# Patient Record
Sex: Male | Born: 1960 | Race: Black or African American | Hispanic: No | Marital: Married | Smoking: Current every day smoker
Health system: Southern US, Community
[De-identification: ages and names within clinical notes are randomized; demographics above are authoritative.]

## PROBLEM LIST (undated history)

## (undated) DIAGNOSIS — I10 Essential (primary) hypertension: Secondary | ICD-10-CM

## (undated) DIAGNOSIS — R634 Abnormal weight loss: Secondary | ICD-10-CM

## (undated) DIAGNOSIS — R079 Chest pain, unspecified: Secondary | ICD-10-CM

## (undated) DIAGNOSIS — F419 Anxiety disorder, unspecified: Secondary | ICD-10-CM

## (undated) DIAGNOSIS — I7121 Aneurysm of the ascending aorta, without rupture: Secondary | ICD-10-CM

## (undated) DIAGNOSIS — R918 Other nonspecific abnormal finding of lung field: Secondary | ICD-10-CM

## (undated) DIAGNOSIS — F1910 Other psychoactive substance abuse, uncomplicated: Secondary | ICD-10-CM

## (undated) DIAGNOSIS — F410 Panic disorder [episodic paroxysmal anxiety] without agoraphobia: Secondary | ICD-10-CM

## (undated) DIAGNOSIS — I719 Aortic aneurysm of unspecified site, without rupture: Secondary | ICD-10-CM

## (undated) DIAGNOSIS — I5022 Chronic systolic (congestive) heart failure: Secondary | ICD-10-CM

## (undated) DIAGNOSIS — I428 Other cardiomyopathies: Secondary | ICD-10-CM

## (undated) DIAGNOSIS — I7772 Dissection of iliac artery: Secondary | ICD-10-CM

## (undated) DIAGNOSIS — Z9289 Personal history of other medical treatment: Secondary | ICD-10-CM

## (undated) DIAGNOSIS — I712 Thoracic aortic aneurysm, without rupture, unspecified: Secondary | ICD-10-CM

## (undated) HISTORY — DX: Personal history of other medical treatment: Z92.89

## (undated) HISTORY — DX: Other cardiomyopathies: I42.8

## (undated) HISTORY — DX: Other nonspecific abnormal finding of lung field: R91.8

## (undated) HISTORY — DX: Other psychoactive substance abuse, uncomplicated: F19.10

## (undated) HISTORY — DX: Chronic systolic (congestive) heart failure: I50.22

## (undated) HISTORY — PX: KNEE ARTHROSCOPY W/ MENISCAL REPAIR: SHX1877

## (undated) HISTORY — DX: Aneurysm of the ascending aorta, without rupture: I71.21

## (undated) HISTORY — DX: Thoracic aortic aneurysm, without rupture, unspecified: I71.20

## (undated) HISTORY — DX: Aortic aneurysm of unspecified site, without rupture: I71.9

## (undated) HISTORY — DX: Abnormal weight loss: R63.4

## (undated) HISTORY — DX: Thoracic aortic aneurysm, without rupture: I71.2

---

## 1997-09-28 ENCOUNTER — Emergency Department (HOSPITAL_COMMUNITY): Admission: EM | Admit: 1997-09-28 | Discharge: 1997-09-28 | Payer: Self-pay | Admitting: Emergency Medicine

## 2006-07-10 ENCOUNTER — Emergency Department (HOSPITAL_COMMUNITY): Admission: EM | Admit: 2006-07-10 | Discharge: 2006-07-10 | Payer: Self-pay | Admitting: Emergency Medicine

## 2007-01-10 ENCOUNTER — Emergency Department (HOSPITAL_COMMUNITY): Admission: EM | Admit: 2007-01-10 | Discharge: 2007-01-10 | Payer: Self-pay | Admitting: Emergency Medicine

## 2010-11-17 LAB — COMPREHENSIVE METABOLIC PANEL
ALT: 22
AST: 23
Albumin: 3.8
Alkaline Phosphatase: 58
BUN: 12
CO2: 27
Calcium: 9.6
Chloride: 103
Creatinine, Ser: 1.25
GFR calc Af Amer: >60
GFR calc non Af Amer: >60
Glucose, Bld: 88
Potassium: 4.4
Sodium: 137
Total Bilirubin: 1.6 — ABNORMAL HIGH
Total Protein: 7.2

## 2010-11-17 LAB — DIFFERENTIAL
Basophils Absolute: 0.1
Basophils Relative: 1
Eosinophils Absolute: 0.1 — ABNORMAL LOW
Eosinophils Relative: 1
Lymphocytes Relative: 32
Lymphs Abs: 3.2
Monocytes Absolute: 0.4
Monocytes Relative: 4
Neutro Abs: 6.3
Neutrophils Relative %: 62

## 2010-11-17 LAB — CBC
HCT: 47.7
Hemoglobin: 16.2
MCHC: 33.9
MCV: 92.6
Platelets: 234
RBC: 5.16
RDW: 13.6
WBC: 10.1

## 2010-11-17 LAB — LIPASE, BLOOD: Lipase: 20

## 2011-10-03 ENCOUNTER — Encounter (HOSPITAL_COMMUNITY)
Admission: EM | Disposition: A | Discharge status: Home / Self Care | Payer: Self-pay | Source: Home / Self Care | Attending: Cardiovascular Disease

## 2011-10-03 ENCOUNTER — Ambulatory Visit (HOSPITAL_COMMUNITY): Admit: 2011-10-03 | Payer: Self-pay | Admitting: Cardiovascular Disease

## 2011-10-03 ENCOUNTER — Inpatient Hospital Stay (HOSPITAL_COMMUNITY): Payer: 59

## 2011-10-03 ENCOUNTER — Inpatient Hospital Stay (HOSPITAL_COMMUNITY)
Admission: EM | Admit: 2011-10-03 | Discharge: 2011-10-04 | DRG: 287 | Disposition: A | Discharge status: Home / Self Care | Payer: 59 | Attending: Cardiovascular Disease | Admitting: Cardiovascular Disease

## 2011-10-03 ENCOUNTER — Encounter (HOSPITAL_COMMUNITY): Payer: Self-pay

## 2011-10-03 DIAGNOSIS — F17200 Nicotine dependence, unspecified, uncomplicated: Secondary | ICD-10-CM | POA: Diagnosis present

## 2011-10-03 DIAGNOSIS — I10 Essential (primary) hypertension: Secondary | ICD-10-CM | POA: Diagnosis present

## 2011-10-03 DIAGNOSIS — Z8249 Family history of ischemic heart disease and other diseases of the circulatory system: Secondary | ICD-10-CM

## 2011-10-03 DIAGNOSIS — Z79899 Other long term (current) drug therapy: Secondary | ICD-10-CM

## 2011-10-03 DIAGNOSIS — R079 Chest pain, unspecified: Principal | ICD-10-CM | POA: Diagnosis present

## 2011-10-03 DIAGNOSIS — R9431 Abnormal electrocardiogram [ECG] [EKG]: Secondary | ICD-10-CM | POA: Diagnosis present

## 2011-10-03 HISTORY — PX: LEFT HEART CATHETERIZATION WITH CORONARY ANGIOGRAM: SHX5451

## 2011-10-03 HISTORY — DX: Chest pain, unspecified: R07.9

## 2011-10-03 HISTORY — DX: Essential (primary) hypertension: I10

## 2011-10-03 LAB — COMPREHENSIVE METABOLIC PANEL
Albumin: 3.1 g/dL — ABNORMAL LOW (ref 3.5–5.2)
BUN: 6 mg/dL (ref 6–23)
Calcium: 9.5 mg/dL (ref 8.4–10.5)
Chloride: 101 mEq/L (ref 96–112)
Creatinine, Ser: 0.83 mg/dL (ref 0.50–1.35)
Total Bilirubin: 1.4 mg/dL — ABNORMAL HIGH (ref 0.3–1.2)
Total Protein: 7.1 g/dL (ref 6.0–8.3)

## 2011-10-03 LAB — CBC WITH DIFFERENTIAL/PLATELET
Basophils Absolute: 0 10*3/uL (ref 0.0–0.1)
Basophils Relative: 0 % (ref 0–1)
Eosinophils Absolute: 0 10*3/uL (ref 0.0–0.7)
Eosinophils Relative: 0 % (ref 0–5)
HCT: 40.8 % (ref 39.0–52.0)
Hemoglobin: 14 g/dL (ref 13.0–17.0)
MCH: 31.9 pg (ref 26.0–34.0)
MCHC: 34.3 g/dL (ref 30.0–36.0)
MCV: 92.9 fL (ref 78.0–100.0)
Monocytes Absolute: 0.9 10*3/uL (ref 0.1–1.0)
Monocytes Relative: 10 % (ref 3–12)
RDW: 14.1 % (ref 11.5–15.5)

## 2011-10-03 LAB — TSH: TSH: 0.028 u[IU]/mL — ABNORMAL LOW (ref 0.350–4.500)

## 2011-10-03 LAB — CARDIAC PANEL(CRET KIN+CKTOT+MB+TROPI)
CK, MB: 1.1 ng/mL (ref 0.3–4.0)
Relative Index: INVALID (ref 0.0–2.5)
Total CK: 35 U/L (ref 7–232)

## 2011-10-03 LAB — HEMOGLOBIN A1C: Mean Plasma Glucose: 105 mg/dL (ref <117)

## 2011-10-03 SURGERY — LEFT HEART CATHETERIZATION WITH CORONARY ANGIOGRAM
Anesthesia: LOCAL

## 2011-10-03 MED ORDER — PNEUMOCOCCAL VAC POLYVALENT 25 MCG/0.5ML IJ INJ
0.5000 mL | INJECTION | INTRAMUSCULAR | Status: DC
  Filled 2011-10-03: qty 0.5

## 2011-10-03 MED ORDER — MORPHINE SULFATE 2 MG/ML IJ SOLN: 1.0000 mg | INTRAMUSCULAR | Status: DC | PRN

## 2011-10-03 MED ORDER — HEPARIN SODIUM (PORCINE) 1000 UNIT/ML IJ SOLN
INTRAMUSCULAR | Status: AC
  Filled 2011-10-03: qty 1

## 2011-10-03 MED ORDER — SODIUM CHLORIDE 0.9 % IV SOLN
INTRAVENOUS | Status: AC
  Administered 2011-10-03: 75 mL/h via INTRAVENOUS

## 2011-10-03 MED ORDER — LABETALOL HCL 5 MG/ML IV SOLN: 20.0000 mg | Freq: Once | INTRAVENOUS | Status: AC

## 2011-10-03 MED ORDER — LISINOPRIL 10 MG PO TABS
10.0000 mg | ORAL_TABLET | Freq: Two times a day (BID) | ORAL | Status: DC
  Administered 2011-10-03 – 2011-10-04 (×2): 10 mg via ORAL
  Filled 2011-10-03 (×3): qty 1

## 2011-10-03 MED ORDER — ONDANSETRON HCL 4 MG/2ML IJ SOLN: 4.0000 mg | Freq: Four times a day (QID) | INTRAMUSCULAR | Status: DC | PRN

## 2011-10-03 MED ORDER — AMLODIPINE BESYLATE 10 MG PO TABS
10.0000 mg | ORAL_TABLET | Freq: Every day | ORAL | Status: DC
  Administered 2011-10-03 – 2011-10-04 (×2): 10 mg via ORAL
  Filled 2011-10-03 (×2): qty 1

## 2011-10-03 MED ORDER — VERAPAMIL HCL 2.5 MG/ML IV SOLN
INTRAVENOUS | Status: AC
  Filled 2011-10-03: qty 2

## 2011-10-03 MED ORDER — NITROGLYCERIN 0.2 MG/ML ON CALL CATH LAB
INTRAVENOUS | Status: AC
  Filled 2011-10-03: qty 1

## 2011-10-03 MED ORDER — ACETAMINOPHEN 325 MG PO TABS: 650.0000 mg | ORAL_TABLET | ORAL | Status: DC | PRN

## 2011-10-03 MED ORDER — LABETALOL HCL 5 MG/ML IV SOLN
INTRAVENOUS | Status: AC
  Administered 2011-10-03: 20 mg
  Filled 2011-10-03: qty 4

## 2011-10-03 MED ORDER — HEPARIN (PORCINE) IN NACL 2-0.9 UNIT/ML-% IJ SOLN
INTRAMUSCULAR | Status: AC
  Filled 2011-10-03: qty 2000

## 2011-10-03 MED ORDER — LIDOCAINE HCL (PF) 1 % IJ SOLN
INTRAMUSCULAR | Status: AC
  Filled 2011-10-03: qty 30

## 2011-10-03 NOTE — H&P (Signed)
   Pt was reexamined and existing H & P reviewed. No changes found.  [name], MD [facility] 10/03/2011 1:07 PM

## 2011-10-03 NOTE — Op Note (Signed)
Allen Saunders is a 51 y.o. male    [id number] LOCATION:  FACILITY: [facility]  PHYSICIAN: Nanetta Batty, M.D. Feb 13, 1960   DATE OF PROCEDURE:  10/03/2011  DATE OF DISCHARGE:  [facility]  CARDIAC CATHETERIZATION     History obtained from chart review. [name] is a 51 year old married Afro-American male father of 2 stepchildren retired Hotel manager who was seen at urgent care today because of coughing and chest wall pain. He was found to have inferior ST segment elevation was transferred to [facility] for further evaluation. Because of this risk factor profile including family history the father died of MI at age 21, tobacco abuse and hypertension along with abnormal EKG and ongoing chest pain it was decided then to the Cath Lab for emergent cath intervention.   PROCEDURE DESCRIPTION:    The patient was brought to the second floor  [facility]iac cath lab in the postabsorptive state. He was not  premedicated .Marland Kitchen His right wrist was prepped and shaved in usual sterile fashion. Xylocaine 1% was used  for local anesthesia. A 6 French sheath was inserted into the right radial  artery using standard Seldinger technique. The patient received  4000 units  of heparin  intravenously.  A 6 [place] TIG catheter along with a 6 French pigtail catheter was used for selective coronary angiography and left ventriculography respectively. Visipaque dye was used for the entirety of the case. Retrograde aortic, left ventricular pullback pressures were recorded.    HEMODYNAMICS:    AO SYSTOLIC/AO DIASTOLIC: 126/80   LV SYSTOLIC/LV DIASTOLIC: 129/7  ANGIOGRAPHIC RESULTS:   1. Left main; normal  2. LAD; normal 3. Left circumflex; normal and nondominant.  4. Right coronary artery; normal and dominant 5. Left ventriculography; RAO left ventriculogram was performed using  25 cc of Visipaque dye at 12 cc per second. There were no wall motion abnormalities.the EF was estimated  visually at 60%.  IMPRESSION:[name] has normal coronary arteries and normal left ventricular function. Believe his chest pain is chest wall from coughing and his EKG or represents early repolarization pattern. Continued medical therapy will be recommended. The sheath was removed and it a TR band is placed on the right wrist chief patent hemostasis. The patient left the Cath Lab in stable condition. Initially hydrated and discharged home in the morning.  [name] MD, [facility] 10/03/2011 1:11 PM

## 2011-10-03 NOTE — ED Notes (Signed)
Patient stated that he only gets sore throat with ASA but not all the time and that he takes it. Denies any rash, no swelling, CP nor SOB.

## 2011-10-03 NOTE — ED Notes (Signed)
Dr. Gery Pray is at the bedside.

## 2011-10-03 NOTE — H&P (Signed)
Allen Saunders is an 51 y.o. male.   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  Thr pain was like someone standing on his chest-per his wife.  He had to sit up due to severity of pain.  He also complained of throat discomfort.  He went to Urgent Care and EKG was done with ST elevatin in inf. Lat leads, though could be early repol.  But due to severity of pain, and family history of CAD with pt's use of tobacco and his HTN, poorly controlled today, it was decided to go to the cath lab.   Past Medical History  Diagnosis Date  . Hypertension   . Chest pain, with abnormal EKG 10/03/2011  . HTN (hypertension), malignant 10/03/2011    History reviewed. No pertinent past surgical history.  Family History  Problem Relation Age of Onset  . Coronary artery disease Father    Social History:  reports that he has been smoking.  He does not have any smokeless tobacco history on file. He reports that he does not drink alcohol or use illicit drugs. marrried with 2 children and 2 step children  Allergies:  Allergies  Allergen Reactions  . Asa (Aspirin) Other (See Comments)    Sore throat, gi upset    Medications Prior to Admission  Medication Sig Dispense Refill  . amLODipine (NORVASC) 10 MG tablet Take 10 mg by mouth daily.      . Thiamine HCl (VITAMIN B-1 PO) Take 1 tablet by mouth daily.        No results found for this or any previous visit (from the past 48 hour(s)). No results found.  ROS: General:no colds or fevers Skin:no rashes or ulcers HEENT:normocephalic, sclera clear WU:JWJXB pain as described PUL:no SOB GI:no diarrhea, no constipation GU:no hematuria, no dysuria MS:no joint pain Neuro:no syncope,no dizziness Endo:no diabetes, no thyroid disease   Blood pressure 142/100, pulse 101, temperature 98.1 F (36.7 C), temperature source Oral,  resp. rate 16, height 6' 4.5" (1.943 m), weight 70.761 kg (156 lb), SpO2 100.00%. PE: General:alert and oriented X 3 NAD Skin:Warm and dry, no rashes HEENT:normocephalic, sclera clear Neck:supple, no JVD, no bruits Heart:S1S2, RRR without murmur galllup rub or click Lungs:clear, no rales or wheezes Abd:+ BS, soft nontender, + BS  Ext:no edema  2+ pedal and post tib pulses Neuro:alert and oriented X 3    Assessment/Plan Active Problems:  Chest pain, with abnormal EKG   HTN uncontrolled  PLAN: To cath lab emergently.  JYNWGN,FAOZH R 10/03/2011, 12:45 PM    Agree with note written by Nada Boozer RNP  51 year old black male with atypical chest wall pain, coughing and early  repolarization changes on EKG with receptors include positive family history, 2 hypertension and tobacco abuse. His exam is normal. Because of his abnormal EKG and chest pain the plan will be to bring him to the cath lab to rule out an ischemic etiology.   Allen Saunders 10/03/2011 1:24 PM

## 2011-10-03 NOTE — ED Notes (Signed)
To cath Lab via stretcher accompanied by RN and [facility] ED Tech

## 2011-10-03 NOTE — ED Notes (Signed)
Patient was brought in by ambulance from Urgent Care with possible STEMI. Patient stated that he has been coughing since last night with chest soreness and sore throat. Patient was given ASA 324 mg po and NTG 1 tab SL. Patient's initial BP was 188/110 and after he was given NTG, his BP was 158/100.

## 2011-10-03 NOTE — Progress Notes (Signed)
Chaplain Note:  Chaplain responded to code STEMI page.  Chaplain provided spiritual comfort and support for pt, family, and cath lab staff.  Chaplain acted as Print production planner between lab and  pt's family.  Family, staff, and pt expressed appreciation for chaplain support.  Chaplain will follow up as needed.  10/03/11 1220  Clinical Encounter Type  Visited With Patient;Family  Visit Type Spiritual support;Code (Code STEMI)  Referral From Other (Comment) (Code STEMI page)  Spiritual Encounters  Spiritual Needs Emotional  Stress Factors  Patient Stress Factors Loss of control;Health changes  Family Stress Factors Loss of control   Verdie Shire, chaplain [phone]

## 2011-10-04 LAB — BASIC METABOLIC PANEL
BUN: 8 mg/dL (ref 6–23)
CO2: 29 mEq/L (ref 19–32)
Chloride: 102 mEq/L (ref 96–112)
GFR calc Af Amer: >90 mL/min (ref >90)
Potassium: 4 mEq/L (ref 3.5–5.1)

## 2011-10-04 LAB — CBC
HCT: 42.1 % (ref 39.0–52.0)
Hemoglobin: 14.1 g/dL (ref 13.0–17.0)
MCV: 94.4 fL (ref 78.0–100.0)
WBC: 9.2 10*3/uL (ref 4.0–10.5)

## 2011-10-04 LAB — LIPID PANEL
HDL: 60 mg/dL (ref >39)
LDL Cholesterol: 79 mg/dL (ref 0–99)
Triglycerides: 76 mg/dL (ref <150)
VLDL: 15 mg/dL (ref 0–40)

## 2011-10-04 MED ORDER — LISINOPRIL 10 MG PO TABS: 10.0000 mg | ORAL_TABLET | Freq: Two times a day (BID) | ORAL | Status: DC

## 2011-10-04 NOTE — Progress Notes (Signed)
Subjective:   Objective: Vital signs in last 24 hours: Temp:  [98.1 F (36.7 C)-99.6 F (37.6 C)] 98.8 F (37.1 C) (08/25 0400) Pulse Rate:  [80-113] 87  (08/25 0500) Resp:  [16-25] 22  (08/24 1700) BP: (117-193)/(74-118) 117/76 mmHg (08/25 0500) SpO2:  [97 %-100 %] 98 % (08/25 0500) Weight:  [70.761 kg (156 lb)-75.6 kg (166 lb 10.7 oz)] 75.6 kg (166 lb 10.7 oz) (08/24 1345) Weight change:    Intake/Output from previous day: -15 08/24 0701 - 08/25 0700 In: 1085 [P.O.:700; I.V.:385] Out: 1100 [Urine:1100] Intake/Output this shift:    PE: General: Skin: HEENT:NCAT Neck:2+ carotids Heart:RRR Lungs:Clear Abd: DGU:YQIHK radial puncture OK Neuro:   Lab Results:  Basename 10/04/11 0555 10/03/11 1536  WBC 9.2 9.3  HGB 14.1 14.0  HCT 42.1 40.8  PLT 156 156   BMET  Basename 10/04/11 0555 10/03/11 1536  NA 139 134*  K 4.0 3.7  CL 102 101  CO2 29 23  GLUCOSE 95 146*  BUN 8 6  CREATININE 1.04 0.83  CALCIUM 9.7 9.5    Basename 10/03/11 1535  TROPONINI <0.30    Lab Results  Component Value Date   CHOL 154 10/04/2011   HDL 60 10/04/2011   LDLCALC 79 10/04/2011   TRIG 76 10/04/2011   CHOLHDL 2.6 10/04/2011   Lab Results  Component Value Date   HGBA1C 5.3 10/03/2011     Lab Results  Component Value Date   TSH 0.028* 10/03/2011    Hepatic Function Panel  Basename 10/03/11 1536  PROT 7.1  ALBUMIN 3.1*  AST 12  ALT 10  ALKPHOS 68  BILITOT 1.4*  BILIDIR --  IBILI --    Basename 10/04/11 0555  CHOL 154   No results found for this basename: PROTIME in the last 72 hours    EKG:No orders found for this or any previous visit.  Studies/Results: Dg Chest 2 View  10/03/2011  *RADIOLOGY REPORT*  Clinical Data: Cough and shortness of breath.  CHEST - 2 VIEW  Comparison: None  Findings: The cardiac silhouette, mediastinal and hilar contours are normal.  The lungs are clear.  No pleural effusion or pneumothorax. Tiny nodule in the left upper lobe could be a  calcified granuloma or something in the rib.  I would recommend follow-up chest x-ray in4- 6 months to reevaluate.  The bony thorax is intact.  A mild pectus deformity is noted.  IMPRESSION:  1.  No acute cardiopulmonary findings. 2.  Possible small left lower lobe pulmonary nodule.  Recommend follow-up chest x-ray in 4-6 months.   Original Report Authenticated By: P. Loralie Champagne, M.D.     Medications: I have reviewed the patient's current medications.    Marland Kitchen amLODipine  10 mg Oral Daily  . heparin      . heparin      . labetalol      . labetalol  20 mg Intravenous Once  . lidocaine      . lisinopril  10 mg Oral BID  . nitroGLYCERIN      . pneumococcal 23 valent vaccine  0.5 mL Intramuscular Tomorrow-1000  . verapamil       Assessment/Plan: Active Problems:  Chest pain, with abnormal EKG, non cardiac, patent cors. on cath 10/03/11   HTN uncontrolled   PLAN: hypertension now controlled with IV Labetalol, and addition of Lisinopril.  Ambulate and D/C home if no further problems. TSH is low at 0.028 this was drawn after cath.   LOS: 1  day   INGOLD,LAURA R 10/04/2011, 7:45 AM  Agree with note written by Nada Boozer RNP  Clean cath. EKG prob early repolarization changes (nl variant for him). Enz neg. Labs otherwise OK except for low TSH. Small lung nodule on CXR that will need close follow up. OK for D/C home. Follow up with Tally Joe PAC at [facility].Needs CRF modification/smoking cessation.  [name] 10/04/2011 8:42 AM

## 2011-10-05 LAB — POCT I-STAT, CHEM 8
Calcium, Ion: 1.24 mmol/L — ABNORMAL HIGH (ref 1.12–1.23)
HCT: 40 % (ref 39.0–52.0)
Hemoglobin: 13.6 g/dL (ref 13.0–17.0)
TCO2: 20 mmol/L (ref 0–100)

## 2011-10-05 NOTE — Progress Notes (Signed)
Retro ur review 

## 2011-10-08 NOTE — Discharge Summary (Signed)
**Note Allen-Identified via Obfuscation** Physician Discharge Summary  Patient ID: Allen Saunders MRN: [id number] DOB/AGE: Nov 30, 1960 51 y.o.  Admit date: 10/03/2011 Discharge date: 10/04/2011  Discharge Diagnoses:  Active Problems:  Chest pain, with abnormal EKG, non cardiac, patent cors. on cath 10/03/11, pain due to uncontrolled HTN   HTN uncontrolled   Discharged Condition: good  Procedures: 10/03/2011 combined heart cath by Dr. Nanetta Saunders.  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 Thr pain was like someone standing on his chest-per his wife. He had to sit up due to severity of pain. He also complained of throat discomfort. He went to Urgent Care and EKG was done with ST elevatin in inf. Lat leads, though could be early repol. But due to severity of pain, and family history of CAD with pt's use of tobacco and his HTN, poorly controlled today, it was decided to go to the cath lab.  Cardiac catheterization revealed:  IMPRESSION:[name] has normal coronary arteries and normal left ventricular function. Believe his chest pain is chest wall from coughing and his EKG or represents early repolarization pattern.    His hypertension continued to be uncontrolled post cath he was given IV labetalol with improvement.  By the next morning patient had no further chest pain blood pressure was controlled. He ambulated without complications was seen by Dr. Allyson Saunders and felt stable, ready for discharge home.  He'll followup with his primary care for further chest discomfort due to coughing and hypertension.  Altace was added to his medical regimen.    He'll need followup chest x-ray as an outpatient as well as thyroid evaluation.     Consults: None  Significant Diagnostic Studies: Laboratory data  Cardiac enzymes were negative  Total cholesterol 154 triglycerides 76 HDL 60 LDL 79 BMET    Component Value Date/Time   NA 139 10/04/2011 0555   K 4.0 10/04/2011  0555   CL 102 10/04/2011 0555   CO2 29 10/04/2011 0555   GLUCOSE 95 10/04/2011 0555   BUN 8 10/04/2011 0555   CREATININE 1.04 10/04/2011 0555   CALCIUM 9.7 10/04/2011 0555   GFRNONAA 81* 10/04/2011 0555   GFRAA >90 10/04/2011 0555    CBC    Component Value Date/Time   WBC 9.2 10/04/2011 0555   RBC 4.46 10/04/2011 0555   HGB 14.1 10/04/2011 0555   HCT 42.1 10/04/2011 0555   PLT 156 10/04/2011 0555   MCV 94.4 10/04/2011 0555   MCH 31.6 10/04/2011 0555   MCHC 33.5 10/04/2011 0555   RDW 14.2 10/04/2011 0555   LYMPHSABS 1.8 10/03/2011 1536   MONOABS 0.9 10/03/2011 1536   EOSABS 0.0 10/03/2011 1536   BASOSABS 0.0 10/03/2011 1536   TSH 0.028 this was drawn post cath needs to be rechecked as an outpatient.  Hemoglobin A1c 5.3  Two-view chest x-ray on admission 1. No acute cardiopulmonary findings.  2. Possible small left lower lobe pulmonary nodule. Recommend  follow-up chest x-ray in 4-6 months.     Discharge Exam: Blood pressure 127/80, pulse 86, temperature 98 F (36.7 C), temperature source Oral, resp. rate 18, height 6\' 6"  (1.981 m), weight 75.6 kg (166 lb 10.7 oz), SpO2 99.00%. PE: HEENT:NCAT  Neck:2+ carotids  Heart:RRR  Lungs:Clear  WUJ:WJXBJ radial puncture OK    Disposition: 01-Home or Self Care   Medication List  As of 10/08/2011 12:13 PM   TAKE these medications  amLODipine 10 MG tablet   Commonly known as: NORVASC   Take 10 mg by mouth daily.      lisinopril 10 MG tablet   Commonly known as: PRINIVIL,ZESTRIL   Take 1 tablet (10 mg total) by mouth 2 (two) times daily.      VITAMIN B-1 PO   Take 1 tablet by mouth daily.           Follow-up Information    Follow up with Millsaps, Allen Millin, NP. (call and make an appointment for follow up for an abnormal thyroid study and an abnormal CXR  )    Contact information:   [facility] [address] [place] [address] [phone]        Discharge instructions:  We  increased your BP meds, added a new medication.    Stop smoking.  Heart Healthy Diet.  Good News your coronary arteries are open.  No lifting for 5 days with rt arm No driving for 2 days   Signed: INGOLD,LAURA Saunders 10/08/2011, 12:13 PM

## 2012-01-03 ENCOUNTER — Emergency Department (HOSPITAL_COMMUNITY): Payer: 59

## 2012-01-03 ENCOUNTER — Encounter (HOSPITAL_COMMUNITY): Payer: Self-pay | Admitting: *Deleted

## 2012-01-03 ENCOUNTER — Emergency Department (HOSPITAL_COMMUNITY)
Admission: EM | Admit: 2012-01-03 | Discharge: 2012-01-03 | Disposition: A | Discharge status: Home / Self Care | Payer: 59 | Attending: Emergency Medicine | Admitting: Emergency Medicine

## 2012-01-03 DIAGNOSIS — F17200 Nicotine dependence, unspecified, uncomplicated: Secondary | ICD-10-CM | POA: Insufficient documentation

## 2012-01-03 DIAGNOSIS — F410 Panic disorder [episodic paroxysmal anxiety] without agoraphobia: Secondary | ICD-10-CM

## 2012-01-03 DIAGNOSIS — Z79899 Other long term (current) drug therapy: Secondary | ICD-10-CM | POA: Insufficient documentation

## 2012-01-03 DIAGNOSIS — I10 Essential (primary) hypertension: Secondary | ICD-10-CM

## 2012-01-03 DIAGNOSIS — R51 Headache: Secondary | ICD-10-CM | POA: Insufficient documentation

## 2012-01-03 LAB — COMPREHENSIVE METABOLIC PANEL
AST: 30 U/L (ref 0–37)
Albumin: 3.8 g/dL (ref 3.5–5.2)
Alkaline Phosphatase: 97 U/L (ref 39–117)
Chloride: 100 mEq/L (ref 96–112)
Creatinine, Ser: 1.41 mg/dL — ABNORMAL HIGH (ref 0.50–1.35)
Potassium: 4.1 mEq/L (ref 3.5–5.1)
Sodium: 136 mEq/L (ref 135–145)
Total Bilirubin: 0.4 mg/dL (ref 0.3–1.2)

## 2012-01-03 LAB — CBC
Platelets: 241 10*3/uL (ref 150–400)
RDW: 14.2 % (ref 11.5–15.5)
WBC: 12.5 10*3/uL — ABNORMAL HIGH (ref 4.0–10.5)

## 2012-01-03 LAB — POCT I-STAT TROPONIN I

## 2012-01-03 MED ORDER — LORAZEPAM 1 MG PO TABS: 1.0000 mg | ORAL_TABLET | Freq: Three times a day (TID) | ORAL | Status: DC | PRN

## 2012-01-03 MED ORDER — METOPROLOL TARTRATE 12.5 MG HALF TABLET: 12.5000 mg | ORAL_TABLET | Freq: Two times a day (BID) | ORAL | Status: DC

## 2012-01-03 NOTE — ED Notes (Signed)
EKG given to Dr. Anitra Lauth and copy was placed in pt chart.

## 2012-01-03 NOTE — ED Provider Notes (Addendum)
History     CSN: [id number]  Arrival date & time 01/03/12  1804   First MD Initiated Contact with Patient 01/03/12 1921      Chief Complaint  Patient presents with  . Hypertension  . Headache    (Consider location/radiation/quality/duration/timing/severity/associated sxs/prior treatment) HPI Comments: While he is driving in the car his hands and feet will become sweaty he will feel very shaky, dizzy with palpitations and nervous. His head will pound. The only thing that makes it better is resting and breathing through it sometimes it can last 30 minutes to an hour. These episodes did not start until after he left the hospital after a cardiac evaluation and started on lisinopril  Patient is a 51 y.o. male presenting with hypertension, headaches, and anxiety. The history is provided by the patient.  Hypertension This is a chronic problem. The problem occurs constantly. The problem has not changed since onset.Associated symptoms include headaches. The symptoms are aggravated by stress. The symptoms are relieved by rest. The treatment provided no relief.  Headache   Anxiety This is a new (Patient states that it always seems to be related to being in the car by himself.) problem. Episode onset: Three- 2 months. The problem occurs every several days. The problem has been gradually worsening. Associated symptoms include headaches.    Past Medical History  Diagnosis Date  . Hypertension   . Chest pain, with abnormal EKG 10/03/2011    History reviewed. No pertinent past surgical history.  Family History  Problem Relation Age of Onset  . Coronary artery disease Father     History  Substance Use Topics  . Smoking status: Current Every Day Smoker  . Smokeless tobacco: Not on file  . Alcohol Use: Yes     Comment: beer daily      Review of Systems  Musculoskeletal:       Left knee pain that has been worsening since a fall several months ago. Patient noticed swelling 2-3 days ago  and pain with walking  Neurological: Positive for headaches.  All other systems reviewed and are negative.    Allergies  Asa  Home Medications   Current Outpatient Rx  Name  Route  Sig  Dispense  Refill  . AMLODIPINE BESYLATE 10 MG PO TABS   Oral   Take 10 mg by mouth daily.         Marland Kitchen LISINOPRIL 10 MG PO TABS   Oral   Take 1 tablet (10 mg total) by mouth 2 (two) times daily.   60 tablet   11     BP 178/103  Pulse 96  Temp 98.3 F (36.8 C) (Oral)  Resp 20  SpO2 100%  Physical Exam  Nursing note and vitals reviewed. Constitutional: He is oriented to person, place, and time. He appears well-developed and well-nourished. No distress.  HENT:  Head: Normocephalic and atraumatic.  Mouth/Throat: Oropharynx is clear and moist.  Eyes: Conjunctivae normal and EOM are normal. Pupils are equal, round, and reactive to light.  Neck: Normal range of motion. Neck supple.  Cardiovascular: Normal rate, regular rhythm and intact distal pulses.   No murmur heard. Pulmonary/Chest: Effort normal and breath sounds normal. No respiratory distress. He has no wheezes. He has no rales.  Abdominal: Soft. He exhibits no distension. There is no tenderness. There is no rebound and no guarding.  Musculoskeletal: Normal range of motion. He exhibits no edema and no tenderness.       Left knee: He exhibits  swelling and effusion. He exhibits normal range of motion and no deformity. tenderness found. Medial joint line and lateral joint line tenderness noted.  Neurological: He is alert and oriented to person, place, and time.  Skin: Skin is warm and dry. No rash noted. No erythema.  Psychiatric: He has a normal mood and affect. His behavior is normal.    ED Course  Procedures (including critical care time)  Labs Reviewed  CBC - Abnormal; Notable for the following:    WBC 12.5 (*)     All other components within normal limits  COMPREHENSIVE METABOLIC PANEL - Abnormal; Notable for the following:      Glucose, Bld 118 (*)     Creatinine, Ser 1.41 (*)     GFR calc non Af Amer 56 (*)     GFR calc Af Amer 65 (*)     All other components within normal limits  POCT I-STAT TROPONIN I  TSH  T4, FREE   Dg Chest 2 View  01/03/2012  *RADIOLOGY REPORT*  Clinical Data: Palpitations.  CHEST - 2 VIEW  Comparison: Chest x-ray 10/03/2011.  Findings: Lung volumes are normal.  No consolidative airspace disease.  No pleural effusions.  No pneumothorax.  No pulmonary nodule or mass noted.  Pulmonary vasculature and the cardiomediastinal silhouette are within normal limits.  Tiny dense nodular opacity again noted projecting over the lateral aspect of the left upper lobe and interface between the anterolateral 3rd rib and posterolateral fifth rib; this finding is consistent with either a small calcified granuloma or a small bone island within the rib.  IMPRESSION: 1. No radiographic evidence of acute cardiopulmonary disease.   Original Report Authenticated By: Trudie Reed, M.D.    Dg Knee Complete 4 Views Left  01/03/2012  *RADIOLOGY REPORT*  Clinical Data: Injury, pain.  LEFT KNEE - COMPLETE 4+ VIEW  Comparison: None.  Findings: No fracture or dislocation is identified.  The patient has a small joint effusion.  There is a small osteochondroma off the lateral cortex of the proximal tibial metaphysis.  IMPRESSION:  1.  Negative for fracture. 2.  Small joint effusion. 3.  Small osteochondroma proximal tibia.   Original Report Authenticated By: Holley Dexter, M.D.     Date: 01/03/2012  Rate: 77  Rhythm: normal sinus rhythm  QRS Axis: normal  Intervals: normal  ST/T Wave abnormalities: nonspecific ST changes  Conduction Disutrbances:none  Narrative Interpretation:   Old EKG Reviewed: unchanged    No diagnosis found.    MDM   The patient is presenting today do to hypertension and what sounds like panic attacks. He states these attacks have been happening ever since he was discharged in the  hospital on lisinopril. He states they usually happen in the car. He starts feeling sweaty, palpitations and extremely panicked. He is unable to drive a car after this he starting to get more and more episodes. He denies shut chest pain or shortness of breath. He does have a history of a possible MI at the end of August but did not require stenting. He's been following up at urgent care however they have continued the lisinopril. He states he's also had a significant amount of weight loss of 30 pounds over the last 4-5 months. He states that they were supposed to check his thyroid but nobody has.  He does have a doctor at the [place] but is not scheduled to see them until sometime next week. Today on arrival patient's blood pressure is 173/104. He has  taken the Norvasc today but has not taken lisinopril. He's been on Norvasc for years and states he's had no issues with this. Labs show a mild elevation in his creatinine from 1.0-1.4. Electrolytes are within normal limits. Chest x-ray and EKG are negative. Thyroid test were sent. Patient could be having an adverse reaction from the lisinopril or this could be thyroid related or this could be depression manifesting as anxiety do to recent medical issues loss of his job and not feeling him not his normal self.  Will have him DC the lisinopril and will start him on a second agent as his blood pressure does continue to be elevated here. There no signs of endorgan damage. We'll also give the patient a prescription for a small amount of Ativan that he can take when he has an episode and he will followup at the [place] next week. 9:05 PM No acute findings.  Will d/c home.  h- [phone] c- [id number]  Gwyneth Sprout, MD 01/03/12 2107  Gwyneth Sprout, MD 01/05/12 2108

## 2012-01-03 NOTE — ED Notes (Addendum)
Pt reports having episodes of hands and feet sweating, anxiety, headache and feeling dizzy. Has been having episodes for extended amount of time, having problems with htn. bp 173/104 at triage. Reports that he takes norvasc and lisinopril for bp, just ran out of lisinopril and did not take it today.

## 2012-01-04 LAB — TSH: TSH: 1.265 u[IU]/mL (ref 0.350–4.500)

## 2012-01-04 LAB — T4, FREE: Free T4: 0.89 ng/dL (ref 0.80–1.80)

## 2012-12-20 ENCOUNTER — Encounter (HOSPITAL_COMMUNITY): Payer: Self-pay | Admitting: Emergency Medicine

## 2012-12-20 ENCOUNTER — Emergency Department (HOSPITAL_COMMUNITY)
Admission: EM | Admit: 2012-12-20 | Discharge: 2012-12-20 | Disposition: A | Discharge status: Home / Self Care | Payer: 59 | Attending: Emergency Medicine | Admitting: Emergency Medicine

## 2012-12-20 ENCOUNTER — Emergency Department (HOSPITAL_COMMUNITY): Payer: 59

## 2012-12-20 DIAGNOSIS — Y92009 Unspecified place in unspecified non-institutional (private) residence as the place of occurrence of the external cause: Secondary | ICD-10-CM | POA: Insufficient documentation

## 2012-12-20 DIAGNOSIS — W1809XA Striking against other object with subsequent fall, initial encounter: Secondary | ICD-10-CM | POA: Diagnosis not present

## 2012-12-20 DIAGNOSIS — F410 Panic disorder [episodic paroxysmal anxiety] without agoraphobia: Secondary | ICD-10-CM | POA: Diagnosis not present

## 2012-12-20 DIAGNOSIS — I10 Essential (primary) hypertension: Secondary | ICD-10-CM | POA: Diagnosis not present

## 2012-12-20 DIAGNOSIS — Y9389 Activity, other specified: Secondary | ICD-10-CM | POA: Insufficient documentation

## 2012-12-20 DIAGNOSIS — S20219A Contusion of unspecified front wall of thorax, initial encounter: Secondary | ICD-10-CM | POA: Insufficient documentation

## 2012-12-20 DIAGNOSIS — W010XXA Fall on same level from slipping, tripping and stumbling without subsequent striking against object, initial encounter: Secondary | ICD-10-CM | POA: Diagnosis not present

## 2012-12-20 DIAGNOSIS — Z79899 Other long term (current) drug therapy: Secondary | ICD-10-CM | POA: Diagnosis not present

## 2012-12-20 DIAGNOSIS — S298XXA Other specified injuries of thorax, initial encounter: Secondary | ICD-10-CM | POA: Diagnosis present

## 2012-12-20 DIAGNOSIS — IMO0002 Reserved for concepts with insufficient information to code with codable children: Secondary | ICD-10-CM | POA: Insufficient documentation

## 2012-12-20 DIAGNOSIS — F17200 Nicotine dependence, unspecified, uncomplicated: Secondary | ICD-10-CM | POA: Diagnosis not present

## 2012-12-20 DIAGNOSIS — S20211A Contusion of right front wall of thorax, initial encounter: Secondary | ICD-10-CM

## 2012-12-20 HISTORY — DX: Anxiety disorder, unspecified: F41.9

## 2012-12-20 HISTORY — DX: Panic disorder (episodic paroxysmal anxiety): F41.0

## 2012-12-20 MED ORDER — OXYCODONE-ACETAMINOPHEN 5-325 MG PO TABS: 1.0000 | ORAL_TABLET | ORAL | Status: DC | PRN

## 2012-12-20 NOTE — ED Notes (Signed)
Pt fell this am striking right posterior ribs on a chair. Has large abrasion in this area. Pt here today for ongoing pain.

## 2012-12-20 NOTE — ED Provider Notes (Signed)
CSN: [id number]     Arrival date & time 12/20/12  1603 History   First MD Initiated Contact with Patient 12/20/12 1735    This chart was scribed for Allen Forth PA-C, a non-physician practitioner working with [name], MD by Lewanda Rife, ED Scribe. This patient was seen in room TR08C/TR08C and the patient's care was started at 5:54 PM      Chief Complaint  Patient presents with  . Rib Injury   (Consider location/radiation/quality/duration/timing/severity/associated sxs/prior Treatment) The history is provided by the patient. No language interpreter was used.   HPI Comments: Allen Saunders is a 52 y.o. male who presents to the Emergency Department with PMHx of HTN, and anxiety complaining of constant moderate right sided rib pain onset 4:30 this morning following a mechanical fall after tripping over a folding chair in his garage. Reports associated mild pain with inspiration, and superficial abrasion on right side. Reports pain is exacerbated by touch and alleviated by nothing. Denies associated head injury, neck pain, back pain, LOC, and hematuria. 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 Denies associated other pertinent surgical hx. Reports taking prescribed medications as prescribed. Reports tetanus status is up to date.     Past Medical History  Diagnosis Date  . Hypertension   . Chest pain, with abnormal EKG 10/03/2011  . Anxiety   . Panic attack    History reviewed. No pertinent past surgical history. Family History  Problem Relation Age of Onset  . Coronary artery disease Father    History  Substance Use Topics  . Smoking status: Current Every Day Smoker  . Smokeless tobacco: Not on file  . Alcohol Use: Yes     Comment: beer daily    Review of Systems  Constitutional: Negative for fever and chills.  Cardiovascular: Positive for chest pain.  Gastrointestinal: Negative for nausea and vomiting.  Musculoskeletal: Positive for arthralgias and myalgias. Negative for  back pain, joint swelling, neck pain and neck stiffness.  Skin: Negative for wound.  Neurological: Negative for numbness.  Hematological: Does not bruise/bleed easily.  Psychiatric/Behavioral: The patient is not nervous/anxious.   All other systems reviewed and are negative.   A complete 10 system review of systems was obtained and all systems are negative except as noted in the HPI and PMHx.    Allergies  Asa  Home Medications   Current Outpatient Rx  Name  Route  Sig  Dispense  Refill  . amLODipine (NORVASC) 10 MG tablet   Oral   Take 10 mg by mouth daily.         Marland Kitchen EXPIRED: lisinopril (PRINIVIL,ZESTRIL) 10 MG tablet   Oral   Take 1 tablet (10 mg total) by mouth 2 (two) times daily.   60 tablet   11   . LORazepam (ATIVAN) 1 MG tablet   Oral   Take 1 tablet (1 mg total) by mouth every 8 (eight) hours as needed for anxiety.   10 tablet   0   . metoprolol (LOPRESSOR) 12.5 mg TABS   Oral   Take 0.5 tablets (12.5 mg total) by mouth 2 (two) times daily.   30 tablet   0   . oxyCODONE-acetaminophen (PERCOCET/ROXICET) 5-325 MG per tablet   Oral   Take 1-2 tablets by mouth every 4 (four) hours as needed for severe pain.   21 tablet   0    BP 130/93  Pulse 88  Temp(Src) 98.1 F (36.7 C) (Oral)  Resp 16  SpO2 100% Physical  Exam  Nursing note and vitals reviewed. Constitutional: He appears well-developed and well-nourished. No distress.  Awake, alert, nontoxic appearance  HENT:  Head: Normocephalic and atraumatic.  Mouth/Throat: Oropharynx is clear and moist. No oropharyngeal exudate.  Eyes: Conjunctivae are normal. Pupils are equal, round, and reactive to light. No scleral icterus.  Neck: Normal range of motion and full passive range of motion without pain. Neck supple. No spinous process tenderness and no muscular tenderness present. No rigidity. Normal range of motion present.  No midline or paraspinal tenderness  Cardiovascular: Normal rate, regular rhythm,  normal heart sounds and intact distal pulses.   No murmur heard. Pulmonary/Chest: Effort normal and breath sounds normal. No respiratory distress. He has no wheezes. He exhibits tenderness and bony tenderness.    No flail of chest or crepitus  Clear and equal breath sounds with normal breath sounds   Abdominal: Soft. Bowel sounds are normal. He exhibits no mass. There is no tenderness. There is no rebound and no guarding.  Musculoskeletal: Normal range of motion. He exhibits tenderness. He exhibits no edema.       Back:  TTP of right lower ribs  No C-spine, T-spine, or L-spine tenderness midline or paraspinal   Lymphadenopathy:    He has no cervical adenopathy.  Neurological: He is alert.  Speech is clear and goal oriented Moves extremities without ataxia  Skin: Skin is warm and dry. He is not diaphoretic.  Large contusion and abrasion of right lower rib   Psychiatric: He has a normal mood and affect.    ED Course  Procedures (including critical care time) 5:54 PM Nursing Notes Reviewed/ Care Coordinated Applicable Imaging Reviewed and incorporated into ED treatment Discussed results and treatment plan with pt. Pt demonstrates understanding and agrees with plan. Pt informed of return precautions and is comfortable with discharge at this time.     Labs Review Labs Reviewed - No data to display Imaging Review Dg Ribs Unilateral W/chest Right  12/20/2012   CLINICAL DATA:  Posterior right lower rib discomfort.  EXAM: RIGHT RIBS AND CHEST - 3+ VIEW  COMPARISON:  January 03, 2012 chest x-ray  FINDINGS: A metallic BB was placed over the inferior aspect of the left ribcage. Deep to this the lower ribs appear intact. The other ribs appear intact where visualized as well. There is no pleural effusion or pneumothorax. The chest film reveals both lungs to be well-expanded and clear. The cardiac silhouette is normal in size. The pulmonary vascularity is not engorged.  IMPRESSION: There is no  evidence of an acute right rib fracture. There is no pleural effusion or pneumothorax. No acute cardiopulmonary abnormality is demonstrated elsewhere.   Electronically Signed   By: David  [place]   On: 12/20/2012 17:15    EKG Interpretation   None       MDM   1. Rib contusion, right, initial encounter     Allen Saunders presents after fall this AM with right rib pain.  Patient X-Ray negative for obvious fracture or dislocation. I personally reviewed the imaging tests through PACS system. Contusion and abrasion noted on exam without flail segment or crepitus. I reviewed available ER/hospitalization records through the EMR.  Pt declines pain management in ED. Pt advised to follow up with PCP if symptoms persist for possibility of missed fracture diagnosis. Patient given incentive spirometry while in ED, conservative therapy recommended and discussed. Patient will be dc home with pain control & is agreeable with above plan.  It has been  determined that no acute conditions requiring further emergency intervention are present at this time. The patient/guardian have been advised of the diagnosis and plan. We have discussed signs and symptoms that warrant return to the ED, such as changes or worsening in symptoms.   Vital signs are stable at discharge.   BP 130/93  Pulse 88  Temp(Src) 98.1 F (36.7 C) (Oral)  Resp 16  SpO2 100%  Patient/guardian has voiced understanding and agreed to follow-up with the PCP or specialist.    I personally performed the services described in this documentation, which was scribed in my presence. The recorded information has been reviewed and is accurate.   Dahlia Client Danaija Eskridge, PA-C 12/20/12 1756

## 2012-12-20 NOTE — ED Notes (Addendum)
Pt st's he fell this am hitting a chair.  Pt c/o pain in right lower rib area. Abrasion present. No resp. Distress noted at this time

## 2012-12-21 NOTE — ED Provider Notes (Signed)
Medical screening examination/treatment/procedure(s) were performed by non-physician practitioner and as supervising physician I was immediately available for consultation/collaboration.  EKG Interpretation   None         [name], MD 12/21/12 0104

## 2013-11-13 HISTORY — PX: COLONOSCOPY: SHX174

## 2014-01-18 ENCOUNTER — Encounter (HOSPITAL_COMMUNITY): Payer: Self-pay | Admitting: Cardiovascular Disease

## 2014-11-16 ENCOUNTER — Encounter (HOSPITAL_COMMUNITY): Payer: Self-pay | Admitting: Nurse Practitioner

## 2014-11-16 ENCOUNTER — Emergency Department (HOSPITAL_COMMUNITY)
Admission: EM | Admit: 2014-11-16 | Discharge: 2014-11-16 | Disposition: A | Discharge status: Home / Self Care | Payer: 59 | Attending: Emergency Medicine | Admitting: Emergency Medicine

## 2014-11-16 ENCOUNTER — Emergency Department (HOSPITAL_COMMUNITY): Payer: 59

## 2014-11-16 DIAGNOSIS — F410 Panic disorder [episodic paroxysmal anxiety] without agoraphobia: Secondary | ICD-10-CM | POA: Insufficient documentation

## 2014-11-16 DIAGNOSIS — Y9389 Activity, other specified: Secondary | ICD-10-CM | POA: Insufficient documentation

## 2014-11-16 DIAGNOSIS — Z720 Tobacco use: Secondary | ICD-10-CM | POA: Diagnosis not present

## 2014-11-16 DIAGNOSIS — Y998 Other external cause status: Secondary | ICD-10-CM | POA: Insufficient documentation

## 2014-11-16 DIAGNOSIS — Z79899 Other long term (current) drug therapy: Secondary | ICD-10-CM | POA: Diagnosis not present

## 2014-11-16 DIAGNOSIS — W01198A Fall on same level from slipping, tripping and stumbling with subsequent striking against other object, initial encounter: Secondary | ICD-10-CM | POA: Insufficient documentation

## 2014-11-16 DIAGNOSIS — Y9289 Other specified places as the place of occurrence of the external cause: Secondary | ICD-10-CM | POA: Insufficient documentation

## 2014-11-16 DIAGNOSIS — I10 Essential (primary) hypertension: Secondary | ICD-10-CM | POA: Insufficient documentation

## 2014-11-16 DIAGNOSIS — S4992XA Unspecified injury of left shoulder and upper arm, initial encounter: Secondary | ICD-10-CM | POA: Diagnosis present

## 2014-11-16 DIAGNOSIS — S42002A Fracture of unspecified part of left clavicle, initial encounter for closed fracture: Secondary | ICD-10-CM | POA: Diagnosis not present

## 2014-11-16 HISTORY — DX: Dissection of iliac artery: I77.72

## 2014-11-16 MED ORDER — OXYCODONE-ACETAMINOPHEN 5-325 MG PO TABS: 2.0000 | ORAL_TABLET | ORAL | Status: DC | PRN

## 2014-11-16 MED ORDER — OXYCODONE-ACETAMINOPHEN 5-325 MG PO TABS: 1.0000 | ORAL_TABLET | Freq: Once | ORAL | Status: DC

## 2014-11-16 NOTE — ED Provider Notes (Signed)
CSN: [id number]   Arrival date & time 11/16/14 1627  History  By signing my name below, I, Bethel Born, attest that this documentation has been prepared under the direction and in the presence of [employer]. Electronically Signed: Bethel Born, ED Scribe. 11/16/2014. 5:59 PM. Chief Complaint  Patient presents with  . Shoulder Pain    HPI The history is provided by the patient. No language interpreter was used.   Allen Saunders is a 54 y.o. male who presents to the Emergency Department complaining of constant left shoulder pain with sudden onset 2 nights ago after a fall. Pt fell from bed striking the front of his shoulder on a nightstand. He braced himself with the left hand. Movement exacerbates the pain and he feels popping with movement at the shoulder and clavicle. He states that he is unable to lift his arm above his head to undress. Associated symptoms include swelling and bruising at the left shoulder. The swelling has improved after ice. 200 mg of ibuprofen has provided insuficient relief at home. He has injured but not broken the left shoulder in the past. Pt denies numbness, tingling, and change in sensation. He is not driving.   Past Medical History  Diagnosis Date  . Hypertension   . Chest pain, with abnormal EKG 10/03/2011  . Anxiety   . Panic attack   . 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 Left heart catheterization with coronary angiogram N/A 10/03/2011    Procedure: LEFT HEART CATHETERIZATION WITH CORONARY ANGIOGRAM;  Surgeon: [name], MD;  Location: [facility] CATH LAB;  Service: Cardiovascular;  Laterality: N/A;    Family History  Problem Relation Age of Onset  . Coronary artery disease Father     Social History  Substance Use Topics  . Smoking status: Current Every Day Smoker  . Smokeless tobacco: None  . Alcohol Use: Yes     Comment: beer daily     Review of Systems 10 Systems reviewed and all are  negative for acute change except as noted in the HPI. Home Medications   Prior to Admission medications   Medication Sig Start Date End Date Taking? Authorizing Provider  amLODipine (NORVASC) 10 MG tablet Take 10 mg by mouth daily.    Historical Provider, MD  lisinopril (PRINIVIL,ZESTRIL) 10 MG tablet Take 1 tablet (10 mg total) by mouth 2 (two) times daily. 10/04/11 10/03/12  Leone Brand, NP  LORazepam (ATIVAN) 1 MG tablet Take 1 tablet (1 mg total) by mouth every 8 (eight) hours as needed for anxiety. 01/03/12   Gwyneth Sprout, MD  metoprolol (LOPRESSOR) 12.5 mg TABS Take 0.5 tablets (12.5 mg total) by mouth 2 (two) times daily. 01/03/12   Gwyneth Sprout, MD  oxyCODONE-acetaminophen (PERCOCET/ROXICET) 5-325 MG per tablet Take 1-2 tablets by mouth every 4 (four) hours as needed for severe pain. 12/20/12   Hannah Muthersbaugh, PA-C    Allergies  Asa  Triage Vitals: BP 118/89 mmHg  Pulse 104  Temp(Src) 98.8 F (37.1 C) (Oral)  Resp 18  Ht 6' 5.5" (1.969 m)  Wt 146 lb (66.225 kg)  BMI 17.08 kg/m2  SpO2 97%  Physical Exam  Constitutional: He is oriented to person, place, and time. He appears well-developed and well-nourished. No distress.  HENT:  Head: Normocephalic and atraumatic.  Eyes: Conjunctivae are normal. Right eye exhibits no discharge. Left eye exhibits no discharge. No scleral icterus.  Cardiovascular: Normal rate.   Pulmonary/Chest: Effort  normal.  Musculoskeletal: He exhibits tenderness.       Arms: Positive hawkins test, positve Neer's test. Significant pain with flexion/extension/abduction/adduction. Moderate pain felt with internal and external rotation. Left shoulder appears lower than right shoulder. TTP over left clavicle, glenohumeral head and ac joint. No obvious bony deformity, however significant ecchymosis present on anterior aspect of shoulder, clavicle and pectoralis muscle. No edema or open wound.     Neurological: He is alert and oriented to person,  place, and time. No cranial nerve deficit. He exhibits normal muscle tone. Coordination normal.  Strength 5/5 throughout. No sensory deficits.    Skin: Skin is warm and dry. No rash noted. He is not diaphoretic. No erythema. No pallor.  Psychiatric: He has a normal mood and affect. His behavior is normal.  Nursing note and vitals reviewed.   ED Course  Procedures  Pain medication offered to pt who declined. Will apply ice and give pt a sling.  DIAGNOSTIC STUDIES: Oxygen Saturation is 97% on RA,  normal by my interpretation.    COORDINATION OF CARE: 5:47 PM Discussed treatment plan which includes XR and pain management with pt at bedside and pt agreed to the plan.  6:06 PM I re-evaluated the patient and provided an update on the results of XR.   Labs Review- Labs Reviewed - No data to display  Imaging Review Dg Shoulder Left  11/16/2014   CLINICAL DATA:  Larey Seat from the bit head, striking night stand 2 days ago. Pain and swelling since then.  EXAM: LEFT SHOULDER - 2+ VIEW  COMPARISON:  None.  FINDINGS: Humeral head is properly located. Glenohumeral joint is normal. There is a fracture of the distal clavicle which is slightly comminuted but nondisplaced. Regional ribs appear normal.  IMPRESSION: Comminuted but nondisplaced fracture of the distal clavicle.   Electronically Signed   By: Paulina Fusi M.D.   On: 11/16/2014 17:54   I personally reviewed and evaluated these images as a part of my medical decision-making.    MDM   Final diagnoses:  Fx clavicle, left, closed, initial encounter    Patient presents for shoulder injury after falling onto nightstand 2 days ago. Severe ecchymosis and tenderness to palpation of left shoulder and left clavicle. No difficulty breathing or respiratory symptoms. X-ray reveals fracture of the distal clavicle which is slightly comminuted but nondisplaced. Spoke with Dr. Madilyn Hook about this patient who recommends outpatient orthopedic follow-up. Discussed  treatment plan with patient who is agreeable. Patient placed in arm sling for comfort. Return precautions outlined patient discharge instructions. Stable for discharge.  I personally performed the services described in this documentation, which was scribed in my presence. The recorded information has been reviewed and is accurate.       Lester Kinsman [place], PA-C 11/16/14 1858  Tilden Fossa, MD 11/17/14 1341

## 2014-11-16 NOTE — ED Notes (Signed)
Pt in xray

## 2014-11-16 NOTE — ED Notes (Signed)
Patient transported to X-ray 

## 2014-11-16 NOTE — ED Notes (Signed)
Mattress slid out from under him and he fell onto wooden nightstand on L shoulder 2 nights ago. He c/o swelling and pain since. He took 200mg  ibuprofen x2 and applied ice but pain continues. He reports he is unable to lift his arm because it feels like his shulder is going to pop out of place. Pulses intact, can wiggle digits, skin w/d

## 2014-11-16 NOTE — Discharge Instructions (Signed)
Clavicle Fracture The clavicle, also called the collarbone, is the long bone that connects your shoulder to your rib cage. You can feel your collarbone at the top of your shoulders and rib cage. A clavicle fracture is a broken clavicle. It is a common injury that can happen at any age.  CAUSES Common causes of a clavicle fracture include:  A direct blow to your shoulder.  A car accident.  A fall, especially if you try to break your fall with an outstretched arm. RISK FACTORS You may be at increased risk if:  You are younger than 25 years or older than 67 years. Most clavicle fractures happen to people who are younger than 25 years.  You are a male.  You play contact sports. SIGNS AND SYMPTOMS A fractured clavicle is painful. It also makes it hard to move your arm. Other signs and symptoms may include:  A shoulder that drops downward and forward.  Pain when trying to lift your shoulder.  Bruising, swelling, and tenderness over your clavicle.  A grinding noise when you try to move your shoulder.  A bump over your clavicle. DIAGNOSIS Your health care provider can usually diagnose a clavicle fracture by asking about your injury and examining your shoulder and clavicle. He or she may take an X-ray to determine the position of your clavicle. TREATMENT Treatment depends on the position of your clavicle after the fracture:  If the broken ends of the bone are not out of place, your health care provider may put your arm in a sling or wrap a support bandage around your chest (figure-of-eight wrap).  If the broken ends of the bone are out of place, you may need surgery. Surgery may involve placing screws, pins, or plates to keep your clavicle stable while it heals. Healing may take about 3 months. When your health care provider thinks your fracture has healed enough, you may have to do physical therapy to regain normal movement and build up your arm strength. HOME CARE INSTRUCTIONS    Apply ice to the injured area:  Put ice in a plastic bag.  Place a towel between your skin and the bag.  Leave the ice on for 20 minutes, 2-3 times a day.  If you have a wrap or splint:  Wear it all the time, and remove it only to take a bath or shower.  When you bathe or shower, keep your shoulder in the same position as when the sling or wrap is on.  Do not lift your arm.  If you have a figure-of-eight wrap:  Another person must tighten it every day.  It should be tight enough to hold your shoulders back.  Allow enough room to place your index finger between your body and the strap.  Loosen the wrap immediately if you feel numbness or tingling in your hands.  Only take medicines as directed by your health care provider.  Avoid activities that make the injury or pain worse for 4-6 weeks after surgery.  Keep all follow-up appointments. SEEK MEDICAL CARE IF:  Your medicine is not helping to relieve pain and swelling. SEEK IMMEDIATE MEDICAL CARE IF:  Your arm is numb, cold, or pale, even when the splint is loose. MAKE SURE YOU:   Understand these instructions.  Will watch your condition.  Will get help right away if you are not doing well or get worse.   This information is not intended to replace advice given to you by your health care provider.  Make sure you discuss any questions you have with your health care provider.   Document Released: 11/05/2004 Document Revised: 01/31/2013 Document Reviewed: 12/19/2012 Elsevier Interactive Patient Education 2016 [employer]  Make follow up appointment with ortho ASAP. Activity as tolerated. May keep arm in sling for comfort. Take pain medication as needed for pain. Return to the ED if you experience difficulty breathing, worsening of your symptoms, numbness/tingling in your extremities.

## 2016-12-02 DIAGNOSIS — Z9289 Personal history of other medical treatment: Secondary | ICD-10-CM

## 2016-12-02 HISTORY — DX: Personal history of other medical treatment: Z92.89

## 2016-12-03 ENCOUNTER — Encounter: Payer: Self-pay | Admitting: Cardiothoracic Surgery

## 2016-12-09 ENCOUNTER — Encounter: Payer: Self-pay | Admitting: Cardiothoracic Surgery

## 2016-12-09 ENCOUNTER — Encounter: Payer: Self-pay | Admitting: *Deleted

## 2016-12-09 ENCOUNTER — Institutional Professional Consult (permissible substitution) (INDEPENDENT_AMBULATORY_CARE_PROVIDER_SITE_OTHER): Payer: 59 | Admitting: Cardiothoracic Surgery

## 2016-12-09 VITALS — BP 116/89 | HR 83 | Ht 77.5 in | Wt 150.0 lb

## 2016-12-09 DIAGNOSIS — I712 Thoracic aortic aneurysm, without rupture, unspecified: Secondary | ICD-10-CM | POA: Insufficient documentation

## 2016-12-09 DIAGNOSIS — F1910 Other psychoactive substance abuse, uncomplicated: Secondary | ICD-10-CM | POA: Insufficient documentation

## 2016-12-09 NOTE — Progress Notes (Signed)
PCP is Patient, No Pcp Per Referring Provider is Domingo Pulse, PA-C  Chief Complaint  Patient presents with  . 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  Although the patient is unsure of why he had the CT documents report CTA done for history of a dilated thoracic aorta.  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  CTA shows moderate root dilatation measured at 5 cm with ascending aortic diameter at the sinotubular junction of approximately 4 cm.  Also noted were moderate emphysema no at risk pulmonary nodules or masses and no significant mediastinal adenopathy.  The abdominal aorta appeared to be without significant abnormality.  The patient's aortic root morphology appears to be consistent with his body size-height of 6 foot 6 inches.  The patient denies history of cardiac murmur or history of rheumatic fever as a child.  He denies family history of thoracic or abdominal aortic aneurysm disease.  Patient does admit to history of hypertension and is currently taking amlodipine 10 mg a day.  He apparently has been taking lisinopril as well as metoprolol at various times but stopped those medications on his own decision because of what he felt were side effects  Patient smokes 1 pack a day and drinks at least a sixpack of beer daily. He does not take aspirin because he states that is on his allergy list.  Past Medical History:  Diagnosis Date  . Anxiety   . Chest pain, with abnormal EKG 10/03/2011  . Hx of echocardiogram 12/02/2016  .  Hypertension   . 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 Panic attack   . Substance abuse (HCC)    ETOH  . Thoracic aortic aneurysm, without rupture (HCC)    NOTED 06/09/2016 on ECHO    Past Surgical History:  Procedure Laterality Date  . COLONOSCOPY  11/13/2013   h/o diarrhea, diverticula, large hemorrhoids  . KNEE ARTHROSCOPY W/ MENISCAL REPAIR Left    X2 in the 80's  . KNEE ARTHROSCOPY W/ MENISCAL REPAIR Right    x 2 in the 80's  . LEFT HEART CATHETERIZATION WITH CORONARY ANGIOGRAM N/A 10/03/2011   Procedure: LEFT HEART CATHETERIZATION WITH CORONARY ANGIOGRAM;  Surgeon: [name], MD;  Location: [facility] CATH LAB;  Service: Cardiovascular;  Laterality: N/A;    Family History  Problem Relation Age of Onset  . Coronary artery disease Father     Social History Social History  Substance Use Topics  . Smoking status: Current Every Day Smoker    Packs/day: 1.00    Types: Cigarettes    Start date: 02/09/1978  . Smokeless tobacco: Never Used  . Alcohol use Yes     Comment: beer daily    Current Outpatient Prescriptions  Medication Sig Dispense Refill  . amLODipine (NORVASC) 10 MG tablet Take 10 mg by mouth daily.    . Cholecalciferol (VITAMIN D3 PO) Take 2,000 Units by mouth daily.    Marland Kitchen thiamine 50 MG tablet Take 100 mg by mouth daily.     No current facility-administered medications for this visit.     Allergies  Allergen  Reactions  . Asa [Aspirin] Other (See Comments)    Sore throat, gi upset    Review of Systems   Patient had significant problems with his breathing and swallowing and had significant oral surgery to reconstruct his mandible in 2015.  This was associated with a significant weight loss down to 120 pounds.  Now weighs 155 pounds.  The patient is also had bilateral knee surgery/arthroscopy.    Patient denies headache syncope or other neurologic symptoms. He states at one point he had dissection of his iliac arteries however the CTA does not demonstrate any  pathology.  He denies history of angina.  He had a cardiac catheterization 2013 which showed normal coronaries.   BP 116/89   Pulse 83   Ht 6' 5.5" (1.969 m)   Wt 150 lb (68 kg)   SpO2 99%   BMI 17.56 kg/m  Physical Exam       Exam    General- alert and comfortable   Lungs- clear without rales, wheezes   Cor- regular rate and rhythm, no murmur , gallop   Abdomen- soft, non-tender   Extremities - warm, non-tender, minimal edema   Neuro- oriented, appropriate, no focal weakness   Diagnostic Tests: Outside CT scan images reviewed although not in normal format  Impression: Moderate fusiform aneurysm, asymptomatic, of the ascending thoracic aorta.  The patient is at low risk for aortic dissection at the current time, less than 1-2%.  He understands that the best therapy for preventing further dilatation of his thoracic aorta is smoking cessation and compliance with blood pressure medication and regular measurement of blood pressure.  We discussed the symptoms of thoracic aortic enlargement or tear-dissection and the importance of reporting symptoms immediately to seek medical care.  Plan: Blood pressure control, smoking cessation, and surveillance scans of his thoracic aorta are the best option for this patient at this time.  We did discuss resuming metoprolol but he was resistant because of previous side effects.  He will return for a CT of his thoracic aorta in 1 year.   Mikey BussingPeter Van Trigt III, MD [facility] [phone]

## 2017-09-10 ENCOUNTER — Emergency Department (HOSPITAL_BASED_OUTPATIENT_CLINIC_OR_DEPARTMENT_OTHER)
Admission: EM | Admit: 2017-09-10 | Discharge: 2017-09-10 | Disposition: A | Discharge status: Home / Self Care | Payer: 59 | Attending: Emergency Medicine | Admitting: Emergency Medicine

## 2017-09-10 ENCOUNTER — Encounter (HOSPITAL_BASED_OUTPATIENT_CLINIC_OR_DEPARTMENT_OTHER): Payer: Self-pay | Admitting: *Deleted

## 2017-09-10 ENCOUNTER — Other Ambulatory Visit: Payer: Self-pay

## 2017-09-10 DIAGNOSIS — F17210 Nicotine dependence, cigarettes, uncomplicated: Secondary | ICD-10-CM | POA: Diagnosis not present

## 2017-09-10 DIAGNOSIS — I10 Essential (primary) hypertension: Secondary | ICD-10-CM | POA: Insufficient documentation

## 2017-09-10 DIAGNOSIS — Z79899 Other long term (current) drug therapy: Secondary | ICD-10-CM | POA: Diagnosis not present

## 2017-09-10 DIAGNOSIS — B351 Tinea unguium: Secondary | ICD-10-CM | POA: Diagnosis not present

## 2017-09-10 DIAGNOSIS — R1310 Dysphagia, unspecified: Secondary | ICD-10-CM

## 2017-09-10 DIAGNOSIS — R070 Pain in throat: Secondary | ICD-10-CM | POA: Diagnosis present

## 2017-09-10 NOTE — ED Triage Notes (Signed)
Pt c/o " throat problem" x 1 year, unable to swallow

## 2017-09-10 NOTE — ED Notes (Signed)
Pt states has had issues with his throat and digestive system with stoamach aches x 3 years states had surgery to have some bones/ teeth  removed for easier swollowing a few years back and had gained some of his weight back was at 187 then dropped to 119 now back up to 145 but states has been losing weight again now 140. Has consult at [facility] hospital but cannot weight

## 2017-09-10 NOTE — ED Provider Notes (Signed)
[facility] EMERGENCY DEPARTMENT Provider Note   CSN: [id number] Arrival date & time: 09/10/17  1259     History   Chief Complaint Chief Complaint  Patient presents with  . Throat problem    HPI Allen Saunders is a 57 y.o. male.  Patient is a 57 year old male with a history of hypertension, thoracic aneurysm, iliac dissection status post bypass surgery who presents with dysphasia.  He reports that he started having trouble swallowing about 3 years ago.  He has a sensation that food gets stuck in his throat when he swallowing and he has to drink liquids to push it down.  He has no difficulty swallowing liquids.  He states he saw his dentist who said that it was a bony abnormality in his neck and he was referred to a facial surgeon who removed some bones in his throat.  He states his symptoms got better for about a year.  He says he started having the trouble swallowing again about a year ago.  He states he just feels like he cannot take it anymore and wants to get referred to a gastroenterologist.  He is followed by the [facility] who has referred him to gastroenterology but he still does not have an appointment yet.  His next appointment with the [facility] hospital is August 21 with his PCP.  He is frustrated because he continues to lose weight and he feels like he needs to get in to be seen by a gastroenterologist.  He states that he weighed 186 pounds 3 years ago before all this started and he went down to his lowest weight of 119 pounds.  He got back up to about 153 pounds and since that time over the last 3 months has started losing weight again.  He again says that he has no trouble swallowing liquids but whenever he tries to eat solids he gets stuck in the throat and he has to drink liquids to get it down.  He denies any vomiting.  He has some chronic aching in his abdomen which is unchanged over the last year.  He also wants to be referred to podiatry as he has some chronic foot problems  that he has not been able to get taking care of at the [facility].  He is try to get an appointment with a podiatrist through the [facility] but has been unsuccessful.     Past Medical History:  Diagnosis Date  . Anxiety   . Chest pain, with abnormal EKG 10/03/2011  . Hx of echocardiogram 12/02/2016  . Hypertension   . 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 Panic attack   . Substance abuse (HCC)    ETOH  . Thoracic aortic aneurysm, without rupture (HCC)    NOTED 06/09/2016 on ECHO    Patient Active Problem List   Diagnosis Date Noted  . Substance abuse (HCC)   . Thoracic aortic aneurysm, without rupture (HCC)   . Hx of echocardiogram 12/02/2016  . Chest pain, with abnormal EKG, non cardiac, patent cors. on cath 10/03/11, pain due to uncontrolled HTN and coughing 10/03/2011  .  HTN uncontrolled 10/03/2011    Past Surgical History:  Procedure Laterality Date  . COLONOSCOPY  11/13/2013   h/o diarrhea, diverticula, large hemorrhoids  . KNEE ARTHROSCOPY W/ MENISCAL REPAIR Left    X2 in the 80's  . KNEE ARTHROSCOPY W/ MENISCAL REPAIR Right    x 2 in the 80's  . LEFT HEART CATHETERIZATION WITH CORONARY ANGIOGRAM  N/A 10/03/2011   Procedure: LEFT HEART CATHETERIZATION WITH CORONARY ANGIOGRAM;  Surgeon: [name], MD;  Location: [facility] CATH LAB;  Service: Cardiovascular;  Laterality: N/A;        Home Medications    Prior to Admission medications   Medication Sig Start Date End Date Taking? Authorizing Provider  amLODipine (NORVASC) 10 MG tablet Take 10 mg by mouth daily.    [provider]  Cholecalciferol (VITAMIN D3 PO) Take 2,000 Units by mouth daily.    [provider]  thiamine 50 MG tablet Take 100 mg by mouth daily.    [provider]    Family History Family History  Problem Relation Age of Onset  . Coronary artery disease Father     Social History Social History   Tobacco Use  . Smoking status: Current Every Day Smoker    Packs/day: 1.00     Types: Cigarettes    Start date: 02/09/1978  . Smokeless tobacco: Never Used  Substance Use Topics  . Alcohol use: Yes    Comment: beer daily  . Drug use: No     Allergies   Asa [aspirin]   Review of Systems Review of Systems  Constitutional: Positive for unexpected weight change. Negative for appetite change, chills, diaphoresis, fatigue and fever.  HENT: Negative for congestion, rhinorrhea and sneezing.   Eyes: Negative.   Respiratory: Negative for cough, chest tightness and shortness of breath.   Cardiovascular: Negative for chest pain and leg swelling.  Gastrointestinal: Positive for abdominal pain. Negative for blood in stool, diarrhea, nausea and vomiting.       Dysphasia  Genitourinary: Negative for difficulty urinating, flank pain, frequency and hematuria.  Musculoskeletal: Negative for arthralgias and back pain.  Skin: Negative for rash.  Neurological: Negative for dizziness, speech difficulty, weakness, numbness and headaches.     Physical Exam Updated Vital Signs BP (!) 146/90 (BP Location: Left Arm)   Pulse 88   Temp 98.4 F (36.9 C) (Oral)   Resp 16   Ht 6\' 5"  (1.956 m)   Wt 63.6 kg (140 lb 3.2 oz)   SpO2 100%   BMI 16.63 kg/m   Physical Exam  Constitutional: He is oriented to person, place, and time. He appears well-developed and well-nourished.  HENT:  Head: Normocephalic and atraumatic.  Eyes: Pupils are equal, round, and reactive to light.  Neck: Normal range of motion. Neck supple.  Cardiovascular: Normal rate, regular rhythm and normal heart sounds.  Pulmonary/Chest: Effort normal and breath sounds normal. No respiratory distress. He has no wheezes. He has no rales. He exhibits no tenderness.  Abdominal: Soft. Bowel sounds are normal. There is no tenderness. There is no rebound and no guarding.  Musculoskeletal: Normal range of motion. He exhibits no edema.  Patient has some darkened skin to his lower legs and feet.  Pedal pulses are intact and  strong bilaterally.  He does have evidence of onychomycosis of his toenails without any evidence of overlying infection.  Lymphadenopathy:    He has no cervical adenopathy.  Neurological: He is alert and oriented to person, place, and time.  Skin: Skin is warm and dry. No rash noted.  Psychiatric: He has a normal mood and affect.     ED Treatments / Results  Labs (all labs ordered are listed, but only abnormal results are displayed) Labs Reviewed - No data to display  EKG None  Radiology No results found.  Procedures Procedures (including critical care time)  Medications Ordered in  ED Medications - No data to display   Initial Impression / Assessment and Plan / ED Course  I have reviewed the triage vital signs and the nursing notes.  Pertinent labs & imaging results that were available during my care of the patient were reviewed by me and considered in my medical decision making (see chart for details).     Patient presents with dysphasia.  He is able to drink liquids.  He does not have any suggestions of an obstruction.  No difficulty breathing.  He has continued to have some weight loss and I did encourage him to use Ensure or boost supplements.  I will give him a referral follow-up with gastroenterology.  Since he symptoms have been going on around a year, I did not feel that it would be indicated to do labs or blood work today.  I also given referral to follow-up with podiatry.  Return precautions were given.  Final Clinical Impressions(s) / ED Diagnoses   Final diagnoses:  Dysphagia, unspecified type  Onychomycosis    ED Discharge Orders    None       Rolan Bucco, MD 09/10/17 1348

## 2017-09-17 ENCOUNTER — Ambulatory Visit (INDEPENDENT_AMBULATORY_CARE_PROVIDER_SITE_OTHER): Payer: 59

## 2017-09-17 ENCOUNTER — Ambulatory Visit (INDEPENDENT_AMBULATORY_CARE_PROVIDER_SITE_OTHER): Payer: 59 | Admitting: Podiatry

## 2017-09-17 ENCOUNTER — Encounter: Payer: Self-pay | Admitting: Podiatry

## 2017-09-17 VITALS — BP 122/73 | HR 75 | Resp 16

## 2017-09-17 DIAGNOSIS — B351 Tinea unguium: Secondary | ICD-10-CM

## 2017-09-17 DIAGNOSIS — M779 Enthesopathy, unspecified: Secondary | ICD-10-CM | POA: Diagnosis not present

## 2017-09-17 DIAGNOSIS — M79676 Pain in unspecified toe(s): Secondary | ICD-10-CM

## 2017-09-17 DIAGNOSIS — M2040 Other hammer toe(s) (acquired), unspecified foot: Secondary | ICD-10-CM | POA: Diagnosis not present

## 2017-09-17 DIAGNOSIS — M79609 Pain in unspecified limb: Secondary | ICD-10-CM

## 2017-09-17 DIAGNOSIS — L84 Corns and callosities: Secondary | ICD-10-CM

## 2017-09-17 NOTE — Progress Notes (Signed)
   Subjective:    Patient ID: Allen Saunders, male    DOB: [birth year], 57 y.o.   MRN: [id number]  HPI    Review of Systems  All other systems reviewed and are negative.      Objective:   Physical Exam        Assessment & Plan:

## 2017-09-18 NOTE — Progress Notes (Signed)
Subjective:   Patient ID: Allen Saunders, male   DOB: 57 y.o.   MRN: [id number]   HPI Patient presents with severe lesions in the outside of the fifth digits of both feet that are very tender and states is been going on for a while and gotten worse over the last year.  Patient states that he has tried to pad them and has not received relief and patient is a 1 pack-a-day smoker and likes to be active   Review of Systems  All other systems reviewed and are negative.       Objective:  Physical Exam  Constitutional: He appears well-developed and well-nourished.  Cardiovascular: Intact distal pulses.  Pulmonary/Chest: Effort normal.  Musculoskeletal: Normal range of motion.  Neurological: He is alert.  Skin: Skin is warm.  Nursing note and vitals reviewed.   Neurovascular status intact muscle strength adequate range of motion was within normal limits with patient found to have significant rotation fifth digit bilateral with severe distal lateral keratotic lesions that are very painful when pressed and make shoe gear difficult.  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  Discussed ultimate derotational arthroplasty with distal lateral exostectomy but will see results over the next 2 months  X-rays indicate that there is severe rotation fifth digit bilateral with distal lateral keratotic lesion formation

## 2017-10-15 ENCOUNTER — Encounter: Payer: Self-pay | Admitting: Podiatry

## 2017-10-15 ENCOUNTER — Ambulatory Visit (INDEPENDENT_AMBULATORY_CARE_PROVIDER_SITE_OTHER): Payer: 59 | Admitting: Podiatry

## 2017-10-15 DIAGNOSIS — M2040 Other hammer toe(s) (acquired), unspecified foot: Secondary | ICD-10-CM | POA: Diagnosis not present

## 2017-10-15 DIAGNOSIS — L84 Corns and callosities: Secondary | ICD-10-CM | POA: Diagnosis not present

## 2017-10-15 NOTE — Progress Notes (Signed)
Subjective:   Patient ID: Allen Saunders, male   DOB: 57 y.o.   MRN: [id number]   HPI Patient states that the fifth toes have started to get better with callus formation that is slowly reoccurring but not to the same degree as before and patient is having minimal discomfort   ROS      Objective:  Physical Exam  Neurovascular status intact with keratotic lesion fifth digit present right over left but significant diminishment from previous with patient not wearing pads as indicated     Assessment:  Rotational hammertoe deformity fifth digit bilateral with distal lateral keratotic lesions that are painful when palpated     Plan:  H&P condition reviewed and did recommend at one point will get a need to consider derotational arthroplasty but will get a continue with cushioning and padding to try to take pressure off the digits.  Patient will be seen back for [place] to recheck as needed and again I did discuss that at one point surgical intervention on this case will most likely be necessary

## 2017-10-20 ENCOUNTER — Other Ambulatory Visit: Payer: Self-pay | Admitting: Cardiothoracic Surgery

## 2017-10-20 DIAGNOSIS — I712 Thoracic aortic aneurysm, without rupture, unspecified: Secondary | ICD-10-CM

## 2017-12-08 ENCOUNTER — Other Ambulatory Visit: Payer: Self-pay

## 2017-12-08 ENCOUNTER — Telehealth: Payer: Self-pay

## 2017-12-08 ENCOUNTER — Encounter: Payer: Self-pay | Admitting: Cardiothoracic Surgery

## 2017-12-08 ENCOUNTER — Ambulatory Visit
Admission: RE | Admit: 2017-12-08 | Discharge: 2017-12-08 | Disposition: A | Discharge status: Home / Self Care | Payer: 59 | Source: Ambulatory Visit | Attending: Cardiothoracic Surgery | Admitting: Cardiothoracic Surgery

## 2017-12-08 ENCOUNTER — Ambulatory Visit (INDEPENDENT_AMBULATORY_CARE_PROVIDER_SITE_OTHER): Payer: 59 | Admitting: Cardiothoracic Surgery

## 2017-12-08 VITALS — BP 126/80 | HR 76 | Resp 16 | Ht 77.5 in | Wt 143.2 lb

## 2017-12-08 DIAGNOSIS — I712 Thoracic aortic aneurysm, without rupture, unspecified: Secondary | ICD-10-CM

## 2017-12-08 MED ORDER — IOPAMIDOL (ISOVUE-370) INJECTION 76%
75.0000 mL | Freq: Once | INTRAVENOUS | Status: AC | PRN
  Administered 2017-12-08: 75 mL via INTRAVENOUS

## 2017-12-08 NOTE — Progress Notes (Signed)
PCP is [name], MD Referring Provider is Domingo Pulse, PA-C  Chief Complaint  Patient presents with  . 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  He remains asymptomatic without chest pain.  Echocardiogram 2018 reported at the [facility] showed no significant valvular disease with normal LV function.  No family history of thoracic abdominal aneurysm disease. CTA images performed today personally reviewed and counseled patient. The diameter of the aortic root at the sinus of Valsalva remains stable at 5 cm. The ascending aorta measures 4.2 cm.  The aortic pathology is stable from the exam last year.  Patient has reduced his smoking to 1/2 pack/day. He is taking Norvasc daily to control his blood pressure.  Today's blood pressure is 125/70.  Past Medical History:  Diagnosis Date  . Anxiety   . Chest pain, with abnormal EKG 10/03/2011  . Hx of echocardiogram 12/02/2016  . Hypertension   . 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 Panic attack   . Substance abuse (HCC)    ETOH  . Thoracic aortic aneurysm, without rupture (HCC)    NOTED 06/09/2016 on ECHO    Past Surgical History:  Procedure Laterality Date  . COLONOSCOPY  11/13/2013   h/o diarrhea, diverticula, large hemorrhoids  . KNEE ARTHROSCOPY W/ MENISCAL REPAIR Left    X2 in the 80's  . KNEE ARTHROSCOPY W/ MENISCAL REPAIR Right    x 2 in the 80's  . LEFT HEART CATHETERIZATION WITH CORONARY ANGIOGRAM N/A 10/03/2011   Procedure: LEFT HEART CATHETERIZATION WITH CORONARY ANGIOGRAM;  Surgeon: [name], MD;  Location: [facility] CATH LAB;  Service: Cardiovascular;  Laterality: N/A;    Family History  Problem Relation Age of Onset  . Coronary artery disease Father     Social  History Social History   Tobacco Use  . Smoking status: Current Every Day Smoker    Packs/day: 1.00    Types: Cigarettes    Start date: 02/09/1978  . Smokeless tobacco: Never Used  Substance Use Topics  . Alcohol use: Yes    Comment: beer daily  . Drug use: No    Current Outpatient Medications  Medication Sig Dispense Refill  . amLODipine (NORVASC) 10 MG tablet Take 10 mg by mouth daily.    . Cholecalciferol (VITAMIN D3 PO) Take 2,000 Units by mouth daily.    Marland Kitchen thiamine 50 MG tablet Take 100 mg by mouth daily.     No current facility-administered medications for this visit.     Allergies  Allergen Reactions  . Asa [Aspirin] Other (See Comments)    Sore throat, gi upset    Review of Systems  Patient had a fall in his bathroom when he slipped in the bathtub 5 days ago. He has some bruising and pain over his upper back. CT images today show no evidence of thoracic spine or rib pathology.  No pleural effusion.  Patient still drinks alcohol and is evasive about the quantity.  States he smokes approximately half pack cigarettes per day.  BP 126/80 (BP Location: Left Arm, Patient Position: Sitting, Cuff Size: Normal)   Pulse 76   Resp 16   Ht 6' 5.5" (1.969 m)   Wt 143 lb 3.2 oz (65 kg)   SpO2 99%  Comment: ON RA  BMI 16.76 kg/m  Physical Exam      Exam    General- alert and comfortable.  Tall thin AA male no acute distress accompanied by wife    Neck- no JVD, no cervical adenopathy palpable, no carotid bruit   Lungs- clear without rales, wheezes   Cor- regular rate and rhythm, no murmur , gallop   Abdomen- soft, non-tender   Extremities - warm, non-tender, minimal edema   Neuro- oriented, appropriate, no focal weakness   Diagnostic Tests: CTA images shows a smooth fusiform ascending aneurysm, moderate at 4.2 cm. The aortic sinus is larger at 4.9-5.0 cm. Impression: Surgery would not be recommended until the aortic diameter reaches 5.5 cm or if the patient would  need surgery for the aortic valve.  Plan: Patient strongly encouraged to completely stop smoking to reduce risk of further aortic dilatation and dissection.  Also encouraged to remain compliant with his blood pressure medication.  Patient needs annual CTA scanning of his aorta papi and this will be arranged in 1 year.  He knows to call or present to a urgent care facility if he develops sudden severe chest pain.   Mikey Bussing, MD [facility] [phone]

## 2017-12-08 NOTE — Telephone Encounter (Signed)
Ally with [facility] contacted the office requesting a quantity for new prescription of Voltaren Gel.  Advised pharmacy to dispense one tube.  Pharmacy acknowledged receipt.

## 2018-07-22 ENCOUNTER — Emergency Department (HOSPITAL_COMMUNITY)
Admission: EM | Admit: 2018-07-22 | Discharge: 2018-07-22 | Disposition: A | Discharge status: Home / Self Care | Payer: 59 | Attending: Emergency Medicine | Admitting: Emergency Medicine

## 2018-07-22 ENCOUNTER — Emergency Department (HOSPITAL_COMMUNITY): Payer: 59

## 2018-07-22 ENCOUNTER — Other Ambulatory Visit: Payer: Self-pay

## 2018-07-22 ENCOUNTER — Encounter (HOSPITAL_COMMUNITY): Payer: Self-pay | Admitting: Emergency Medicine

## 2018-07-22 DIAGNOSIS — Y939 Activity, unspecified: Secondary | ICD-10-CM | POA: Insufficient documentation

## 2018-07-22 DIAGNOSIS — Y929 Unspecified place or not applicable: Secondary | ICD-10-CM | POA: Insufficient documentation

## 2018-07-22 DIAGNOSIS — S2242XA Multiple fractures of ribs, left side, initial encounter for closed fracture: Secondary | ICD-10-CM

## 2018-07-22 DIAGNOSIS — Y999 Unspecified external cause status: Secondary | ICD-10-CM | POA: Diagnosis not present

## 2018-07-22 DIAGNOSIS — W01190A Fall on same level from slipping, tripping and stumbling with subsequent striking against furniture, initial encounter: Secondary | ICD-10-CM | POA: Diagnosis not present

## 2018-07-22 DIAGNOSIS — F17210 Nicotine dependence, cigarettes, uncomplicated: Secondary | ICD-10-CM | POA: Insufficient documentation

## 2018-07-22 DIAGNOSIS — I10 Essential (primary) hypertension: Secondary | ICD-10-CM | POA: Diagnosis not present

## 2018-07-22 DIAGNOSIS — R21 Rash and other nonspecific skin eruption: Secondary | ICD-10-CM | POA: Insufficient documentation

## 2018-07-22 DIAGNOSIS — M546 Pain in thoracic spine: Secondary | ICD-10-CM | POA: Diagnosis present

## 2018-07-22 MED ORDER — TRIAMCINOLONE ACETONIDE 0.1 % EX CREA: 1.0000 "application " | TOPICAL_CREAM | Freq: Two times a day (BID) | CUTANEOUS | 0 refills | Status: DC

## 2018-07-22 MED ORDER — OXYCODONE-ACETAMINOPHEN 5-325 MG PO TABS: 1.0000 | ORAL_TABLET | Freq: Four times a day (QID) | ORAL | 0 refills | Status: DC | PRN

## 2018-07-22 NOTE — ED Triage Notes (Signed)
Pt in POV, presents with multiple complaints. Pt states he fell a few weeks ago landing on his back and has continued to have pain ever since. Also reports bug bites to entire body X few days, thinks he was bit by bed bugs or from working in his garden and was bit.

## 2018-07-22 NOTE — ED Notes (Signed)
Pt pulling off scabs and keeping them at bedside.

## 2018-07-22 NOTE — ED Notes (Signed)
Rash presenting on shoulders arm and upper chest and groin.

## 2018-07-22 NOTE — ED Provider Notes (Signed)
[facility] EMERGENCY DEPARTMENT Provider Note   CSN: [id number] Arrival date & time: 07/22/18  2013    History   Chief Complaint Chief Complaint  Patient presents with  . Rash  . Back Pain    HPI Allen Saunders is a 57 y.o. male who presents to the ED with multiple complaints. He is currently complaining of posterior back pain s/p fall that occurred 3 weeks ago. Pt states that he tripped and fell due to wearing too large of shoes when he fell backwards and hit his back on a dresser. Pt took 600 mg Ibuprofen 3 weeks ago twice without relief so he stopped taking anything. He is concerned that his ribs could be broken despite no complaints of SOB.   Pt also complains of an itchy rash to diffuse body that began a couple of days ago. He believed it could be bed bugs at first so he washed all of his sheets, clothing, towels, etc without relief. No new foods or medications. The only new thing that patient can think of is working in his garden and placing wood stakes in his tomato patch prior to the rash beginning. No one else in the house has a similar rash. No shortness of breath, lip swelling, throat swelling.        Past Medical History:  Diagnosis Date  . Anxiety   . Chest pain, with abnormal EKG 10/03/2011  . Hx of echocardiogram 12/02/2016  . Hypertension   . 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 Panic attack   . Substance abuse (HCC)    ETOH  . Thoracic aortic aneurysm, without rupture (HCC)    NOTED 06/09/2016 on ECHO    Patient Active Problem List   Diagnosis Date Noted  . Substance abuse (HCC)   . Thoracic aortic aneurysm, without rupture (HCC)   . Hx of echocardiogram 12/02/2016  . Chest pain, with abnormal EKG, non cardiac, patent cors. on cath 10/03/11, pain due to uncontrolled HTN and coughing 10/03/2011  .  HTN uncontrolled 10/03/2011    Past Surgical History:  Procedure Laterality Date  . COLONOSCOPY  11/13/2013   h/o diarrhea, diverticula, large  hemorrhoids  . KNEE ARTHROSCOPY W/ MENISCAL REPAIR Left    X2 in the 80's  . KNEE ARTHROSCOPY W/ MENISCAL REPAIR Right    x 2 in the 80's  . LEFT HEART CATHETERIZATION WITH CORONARY ANGIOGRAM N/A 10/03/2011   Procedure: LEFT HEART CATHETERIZATION WITH CORONARY ANGIOGRAM;  Surgeon: [name], MD;  Location: [facility] CATH LAB;  Service: Cardiovascular;  Laterality: N/A;        Home Medications    Prior to Admission medications   Medication Sig Start Date End Date Taking? Authorizing Provider  amLODipine (NORVASC) 10 MG tablet Take 10 mg by mouth daily.    [provider]  Cholecalciferol (VITAMIN D3 PO) Take 2,000 Units by mouth daily.    [provider]  oxyCODONE-acetaminophen (PERCOCET/ROXICET) 5-325 MG tablet Take 1 tablet by mouth every 6 (six) hours as needed for severe pain. 07/22/18   Tanda Rockers, PA-C  thiamine 50 MG tablet Take 100 mg by mouth daily.    [provider]  triamcinolone cream (KENALOG) 0.1 % Apply 1 application topically 2 (two) times daily. 07/22/18   Tanda Rockers, PA-C    Family History Family History  Problem Relation Age of Onset  . Coronary artery disease Father     Social History Social History   Tobacco Use  . Smoking status:  Current Every Day Smoker    Packs/day: 1.00    Types: Cigarettes    Start date: 02/09/1978  . Smokeless tobacco: Never Used  Substance Use Topics  . Alcohol use: Yes    Comment: beer daily  . Drug use: No     Allergies   Asa [aspirin]   Review of Systems Review of Systems  Constitutional: Negative for chills and fever.  Musculoskeletal: Positive for back pain. Negative for gait problem.  Skin: Positive for rash.     Physical Exam Updated Vital Signs BP (!) 147/90 (BP Location: Left Arm)   Pulse 93   Temp 98.5 F (36.9 C) (Oral)   Resp 18   Ht 6\' 5"  (1.956 m)   Wt 54.4 kg   SpO2 99%   BMI 14.23 kg/m   Physical Exam Vitals signs and nursing note reviewed.   Constitutional:      Appearance: He is not ill-appearing.  HENT:     Head: Normocephalic and atraumatic.  Eyes:     Conjunctiva/sclera: Conjunctivae normal.  Cardiovascular:     Rate and Rhythm: Normal rate and regular rhythm.     Pulses: Normal pulses.  Pulmonary:     Effort: Pulmonary effort is normal.     Breath sounds: Normal breath sounds. No wheezing, rhonchi or rales.     Comments: Left lower rib pain posteriorly; no crepitus Chest:     Chest wall: Tenderness present.  Abdominal:     General: Abdomen is flat.     Tenderness: There is no abdominal tenderness. There is no guarding or rebound.  Musculoskeletal:     Comments: No C, T, or L spine midline tenderness  Skin:    General: Skin is warm and dry.     Coloration: Skin is not jaundiced.     Comments: Diffuse excoriations to body; no lesions to web spaces  Neurological:     Mental Status: He is alert.      ED Treatments / Results  Labs (all labs ordered are listed, but only abnormal results are displayed) Labs Reviewed - No data to display  EKG None  Radiology Dg Chest 2 View  Result Date: 07/22/2018 CLINICAL DATA:  Pain status post fall EXAM: CHEST - 2 VIEW COMPARISON:  12/08/2017.  12/20/2012. FINDINGS: There are multiple displaced left-sided rib fractures involving 8 and ninth ribs posterolaterally on the left. The lungs are somewhat hyperexpanded. Mild emphysematous changes are noted. There is no evidence of a pneumothorax. The heart size is normal. IMPRESSION: 1. Displaced fractures involving the eighth and ninth ribs posterolaterally on the left. 2. No pneumothorax. 3. Somewhat hyperexpanded lungs with mild emphysematous changes. These findings can be seen in patients with COPD. Electronically Signed   By: Constance Holster M.D.   On: 07/22/2018 22:46    Procedures Procedures (including critical care time)  Medications Ordered in ED Medications - No data to display   Initial Impression / Assessment  and Plan / ED Course  I have reviewed the triage vital signs and the nursing notes.  Pertinent labs & imaging results that were available during my care of the patient were reviewed by me and considered in my medical decision making (see chart for details).    Pt presenting with back pain/left rib pain s/p fall that occurred 3 weeks ago as well as rash diffusely to body. No shortness of breath. No new foods, medications. No one else in the house has rash; no signs of airway compromise. Rash does  not appear to be consistent with bed bugs, scabies, or poison ivy; believe this may be a contact dermatitis of some sort; advised to take benadryl as well as apply triamcinolone cream for comfort.   Given left rib pain will obtain CXR at this time to evaluate for rib fractures; patient has no complaints of SOB; he is satting 100% on RA; no crepitus appreciated either.   CXR with findings of rib fractures to 8th and 9th ribs; no PTX or hemothorax. Vital signs stable; given there are only 2 rib fractures and this occurred 3 weeks ago feel patient is stable for discharge. Will send him home with incentive spirometer as well as pain medication. [facility] Strict return precautions discussed as well. Pt is in agreement with plan at this time and stable for discharge home.        Final Clinical Impressions(s) / ED Diagnoses   Final diagnoses:  Closed fracture of multiple ribs of left side, initial encounter  Rash    ED Discharge Orders         Ordered    oxyCODONE-acetaminophen (PERCOCET/ROXICET) 5-325 MG tablet  Every 6 hours PRN     07/22/18 2324    triamcinolone cream (KENALOG) 0.1 %  2 times daily     07/22/18 2324           Tanda RockersVenter, Hulan Szumski, PA-C 07/23/18 0010    Charlynne PanderYao, David Hsienta, MD 07/23/18 1453

## 2018-07-22 NOTE — Discharge Instructions (Addendum)
You were seen in the ED today for a rash as well as back pain after a fall 3 weeks ago. Your chest xray showed 2 rib fractures on your lower left side; please take pain medication as prescribed. Please also use incentive spirometer every hour to ensure you are expanding your lungs properly; this is to prevent a pneumonia from occurring. Please follow up with your PCP at the [facility] regarding your rib fractures. Return to the ED if you begin feeling short of breath, you develop a cough, or a fever  I have prescribed triamcinolone cream for your rash; please follow up with your PCP regarding this as well.

## 2018-10-19 ENCOUNTER — Other Ambulatory Visit: Payer: Self-pay | Admitting: Cardiothoracic Surgery

## 2018-10-19 DIAGNOSIS — I712 Thoracic aortic aneurysm, without rupture, unspecified: Secondary | ICD-10-CM

## 2018-12-05 ENCOUNTER — Other Ambulatory Visit: Payer: Self-pay | Admitting: Cardiothoracic Surgery

## 2018-12-07 ENCOUNTER — Other Ambulatory Visit: Payer: Self-pay

## 2018-12-07 ENCOUNTER — Ambulatory Visit
Admission: RE | Admit: 2018-12-07 | Discharge: 2018-12-07 | Disposition: A | Discharge status: Home / Self Care | Payer: 59 | Source: Ambulatory Visit | Attending: Cardiothoracic Surgery | Admitting: Cardiothoracic Surgery

## 2018-12-07 ENCOUNTER — Ambulatory Visit (INDEPENDENT_AMBULATORY_CARE_PROVIDER_SITE_OTHER): Payer: 59 | Admitting: Cardiothoracic Surgery

## 2018-12-07 ENCOUNTER — Encounter: Payer: Self-pay | Admitting: Cardiothoracic Surgery

## 2018-12-07 VITALS — BP 134/90 | HR 78 | Temp 97.7°F | Resp 20 | Ht 77.0 in | Wt 120.0 lb

## 2018-12-07 DIAGNOSIS — I712 Thoracic aortic aneurysm, without rupture, unspecified: Secondary | ICD-10-CM

## 2018-12-07 MED ORDER — IOPAMIDOL (ISOVUE-370) INJECTION 76%
75.0000 mL | Freq: Once | INTRAVENOUS | Status: AC | PRN
  Administered 2018-12-07: 75 mL via INTRAVENOUS

## 2018-12-07 NOTE — Progress Notes (Signed)
PCP is [name], MD Referring Provider is Domingo Pulse, PA-C  Chief Complaint  Patient presents with  . 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  The patient is a 58 year old with controlled hypertension and significant tobacco abuse and alcohol abuse.  His overall health is poor and his weight is 120 pounds with low BMI.  He has had extensive dental and oral surgery and his chewing and swallowing have been affected over the past year.  He denies chest pain shortness of breath or edema. He takes Norvasc 10 mg daily for hypertension. Echocardiogram 2018 shows normal LV function no significant valvular disease or AI.  I personally reviewed the CTA performed today.  His ascending aorta and root remain stable and mildly enlarged.  No evidence of intramural hematoma or ulceration or heavy calcification.  Past Medical History:  Diagnosis Date  . Anxiety   . Chest pain, with abnormal EKG 10/03/2011  . Hx of echocardiogram 12/02/2016  . Hypertension   . 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 Panic attack   . Substance abuse (HCC)    ETOH  . Thoracic aortic aneurysm, without rupture (HCC)    NOTED 06/09/2016 on ECHO    Past Surgical History:  Procedure Laterality Date  . COLONOSCOPY  11/13/2013   h/o diarrhea, diverticula, large hemorrhoids  . KNEE ARTHROSCOPY W/ MENISCAL REPAIR Left    X2 in the 80's  . KNEE ARTHROSCOPY W/ MENISCAL REPAIR Right    x 2 in the 80's  . LEFT HEART CATHETERIZATION WITH CORONARY ANGIOGRAM N/A 10/03/2011   Procedure: LEFT HEART CATHETERIZATION WITH CORONARY ANGIOGRAM;  Surgeon: [name], MD;  Location: [facility] CATH LAB;  Service: Cardiovascular;  Laterality: N/A;    Family History  Problem Relation Age of Onset  . Coronary artery disease Father     Social History Social History   Tobacco  Use  . Smoking status: Current Every Day Smoker    Packs/day: 1.00    Types: Cigarettes    Start date: 02/09/1978  . Smokeless tobacco: Never Used  Substance Use Topics  . Alcohol use: Yes    Comment: beer daily  . Drug use: No    Current Outpatient Medications  Medication Sig Dispense Refill  . amLODipine (NORVASC) 10 MG tablet Take 10 mg by mouth daily.    . Cholecalciferol (VITAMIN D3 PO) Take 2,000 Units by mouth daily.    Marland Kitchen oxyCODONE-acetaminophen (PERCOCET/ROXICET) 5-325 MG tablet Take 1 tablet by mouth every 6 (six) hours as needed for severe pain. 15 tablet 0  . thiamine 50 MG tablet Take 100 mg by mouth daily.    Marland Kitchen triamcinolone cream (KENALOG) 0.1 % Apply 1 application topically 2 (two) times daily. 45 g 0   No current facility-administered medications for this visit.     Allergies  Allergen Reactions  . Asa [Aspirin] Other (See Comments)    Sore throat, gi upset    Review of Systems  Patient being seen by nutritional support service at the [facility] and is trying to take Ensure No bleeding States he cracked some left ribs after he fell last month but no rib abnormalities noted on today's CT scan  BP 134/90   Pulse 78   Temp 97.7 F (36.5 C) (Skin)   Resp 20   Ht 6'  5" (1.956 m)   Wt 120 lb (54.4 kg)   BMI 14.23 kg/m  Physical Exam Chronically ill appearing tall male no acute distress HEENT normocephalic Neck without bruit or JVD Lungs clear Heart rate regular without murmur Abdomen soft without pulsatile mass Extremities without edema Neuro intact  Diagnostic Tests: CTA shows stable mild ascending and aortic root aneurysm, no change since 2018.  Impression: Continue with blood pressure control Patient needs to cut back on smoking in order to prevent further progression of his vascular disease and needs to improve his nutrition both of which are being addressed by his [facility]. Continue Norvasc 10 mg daily for blood pressure  Plan: Return in 1  year with surveillance CTA   Len Childs, MD [facility] [phone]

## 2019-05-15 ENCOUNTER — Other Ambulatory Visit: Payer: Self-pay

## 2019-05-15 ENCOUNTER — Emergency Department (HOSPITAL_COMMUNITY): Payer: 59

## 2019-05-15 ENCOUNTER — Encounter (HOSPITAL_COMMUNITY): Payer: Self-pay | Admitting: *Deleted

## 2019-05-15 ENCOUNTER — Inpatient Hospital Stay (HOSPITAL_COMMUNITY)
Admission: EM | Admit: 2019-05-15 | Discharge: 2019-05-20 | DRG: 871 | Disposition: A | Discharge status: Home / Self Care | Payer: 59 | Attending: Internal Medicine | Admitting: Internal Medicine

## 2019-05-15 ENCOUNTER — Inpatient Hospital Stay (HOSPITAL_COMMUNITY): Payer: 59

## 2019-05-15 DIAGNOSIS — E876 Hypokalemia: Secondary | ICD-10-CM | POA: Diagnosis present

## 2019-05-15 DIAGNOSIS — E43 Unspecified severe protein-calorie malnutrition: Secondary | ICD-10-CM | POA: Insufficient documentation

## 2019-05-15 DIAGNOSIS — E46 Unspecified protein-calorie malnutrition: Secondary | ICD-10-CM | POA: Diagnosis present

## 2019-05-15 DIAGNOSIS — I5021 Acute systolic (congestive) heart failure: Secondary | ICD-10-CM | POA: Diagnosis present

## 2019-05-15 DIAGNOSIS — R1310 Dysphagia, unspecified: Secondary | ICD-10-CM | POA: Diagnosis present

## 2019-05-15 DIAGNOSIS — R600 Localized edema: Secondary | ICD-10-CM | POA: Diagnosis present

## 2019-05-15 DIAGNOSIS — I351 Nonrheumatic aortic (valve) insufficiency: Secondary | ICD-10-CM | POA: Diagnosis not present

## 2019-05-15 DIAGNOSIS — I10 Essential (primary) hypertension: Secondary | ICD-10-CM | POA: Diagnosis not present

## 2019-05-15 DIAGNOSIS — A419 Sepsis, unspecified organism: Principal | ICD-10-CM | POA: Diagnosis present

## 2019-05-15 DIAGNOSIS — I712 Thoracic aortic aneurysm, without rupture, unspecified: Secondary | ICD-10-CM | POA: Diagnosis present

## 2019-05-15 DIAGNOSIS — I160 Hypertensive urgency: Secondary | ICD-10-CM | POA: Diagnosis present

## 2019-05-15 DIAGNOSIS — F17210 Nicotine dependence, cigarettes, uncomplicated: Secondary | ICD-10-CM | POA: Diagnosis present

## 2019-05-15 DIAGNOSIS — N179 Acute kidney failure, unspecified: Secondary | ICD-10-CM | POA: Diagnosis not present

## 2019-05-15 DIAGNOSIS — R609 Edema, unspecified: Secondary | ICD-10-CM

## 2019-05-15 DIAGNOSIS — R197 Diarrhea, unspecified: Secondary | ICD-10-CM | POA: Diagnosis present

## 2019-05-15 DIAGNOSIS — F1010 Alcohol abuse, uncomplicated: Secondary | ICD-10-CM | POA: Diagnosis present

## 2019-05-15 DIAGNOSIS — M7989 Other specified soft tissue disorders: Secondary | ICD-10-CM

## 2019-05-15 DIAGNOSIS — J189 Pneumonia, unspecified organism: Secondary | ICD-10-CM

## 2019-05-15 DIAGNOSIS — E872 Acidosis, unspecified: Secondary | ICD-10-CM | POA: Diagnosis present

## 2019-05-15 DIAGNOSIS — R64 Cachexia: Secondary | ICD-10-CM | POA: Diagnosis present

## 2019-05-15 DIAGNOSIS — J188 Other pneumonia, unspecified organism: Secondary | ICD-10-CM | POA: Diagnosis present

## 2019-05-15 DIAGNOSIS — Z8249 Family history of ischemic heart disease and other diseases of the circulatory system: Secondary | ICD-10-CM

## 2019-05-15 DIAGNOSIS — I110 Hypertensive heart disease with heart failure: Secondary | ICD-10-CM | POA: Diagnosis present

## 2019-05-15 DIAGNOSIS — I509 Heart failure, unspecified: Secondary | ICD-10-CM | POA: Diagnosis not present

## 2019-05-15 DIAGNOSIS — R7989 Other specified abnormal findings of blood chemistry: Secondary | ICD-10-CM | POA: Diagnosis present

## 2019-05-15 DIAGNOSIS — R54 Age-related physical debility: Secondary | ICD-10-CM | POA: Diagnosis present

## 2019-05-15 DIAGNOSIS — Z681 Body mass index (BMI) 19 or less, adult: Secondary | ICD-10-CM

## 2019-05-15 DIAGNOSIS — D638 Anemia in other chronic diseases classified elsewhere: Secondary | ICD-10-CM | POA: Diagnosis present

## 2019-05-15 DIAGNOSIS — D513 Other dietary vitamin B12 deficiency anemia: Secondary | ICD-10-CM | POA: Diagnosis present

## 2019-05-15 DIAGNOSIS — D539 Nutritional anemia, unspecified: Secondary | ICD-10-CM | POA: Diagnosis present

## 2019-05-15 DIAGNOSIS — E8770 Fluid overload, unspecified: Secondary | ICD-10-CM | POA: Diagnosis present

## 2019-05-15 DIAGNOSIS — Z720 Tobacco use: Secondary | ICD-10-CM | POA: Diagnosis not present

## 2019-05-15 DIAGNOSIS — I420 Dilated cardiomyopathy: Secondary | ICD-10-CM | POA: Diagnosis present

## 2019-05-15 DIAGNOSIS — J9601 Acute respiratory failure with hypoxia: Secondary | ICD-10-CM | POA: Diagnosis present

## 2019-05-15 DIAGNOSIS — J984 Other disorders of lung: Secondary | ICD-10-CM | POA: Diagnosis not present

## 2019-05-15 DIAGNOSIS — I340 Nonrheumatic mitral (valve) insufficiency: Secondary | ICD-10-CM | POA: Diagnosis not present

## 2019-05-15 DIAGNOSIS — Z20822 Contact with and (suspected) exposure to covid-19: Secondary | ICD-10-CM | POA: Diagnosis present

## 2019-05-15 DIAGNOSIS — R000 Tachycardia, unspecified: Secondary | ICD-10-CM

## 2019-05-15 DIAGNOSIS — Z79899 Other long term (current) drug therapy: Secondary | ICD-10-CM

## 2019-05-15 DIAGNOSIS — R778 Other specified abnormalities of plasma proteins: Secondary | ICD-10-CM | POA: Diagnosis present

## 2019-05-15 DIAGNOSIS — I248 Other forms of acute ischemic heart disease: Secondary | ICD-10-CM | POA: Diagnosis present

## 2019-05-15 DIAGNOSIS — R634 Abnormal weight loss: Secondary | ICD-10-CM | POA: Diagnosis present

## 2019-05-15 DIAGNOSIS — R0602 Shortness of breath: Secondary | ICD-10-CM

## 2019-05-15 LAB — TSH: TSH: 2.063 u[IU]/mL (ref 0.350–4.500)

## 2019-05-15 LAB — COMPREHENSIVE METABOLIC PANEL
ALT: 11 U/L (ref 0–44)
AST: 19 U/L (ref 15–41)
Albumin: 2.7 g/dL — ABNORMAL LOW (ref 3.5–5.0)
Alkaline Phosphatase: 75 U/L (ref 38–126)
Anion gap: 16 — ABNORMAL HIGH (ref 5–15)
BUN: 6 mg/dL (ref 6–20)
CO2: 18 mmol/L — ABNORMAL LOW (ref 22–32)
Calcium: 8.9 mg/dL (ref 8.9–10.3)
Chloride: 105 mmol/L (ref 98–111)
Creatinine, Ser: 0.72 mg/dL (ref 0.61–1.24)
GFR calc Af Amer: >60 mL/min (ref >60)
GFR calc non Af Amer: >60 mL/min (ref >60)
Glucose, Bld: 92 mg/dL (ref 70–99)
Potassium: 3.4 mmol/L — ABNORMAL LOW (ref 3.5–5.1)
Sodium: 139 mmol/L (ref 135–145)
Total Bilirubin: 0.7 mg/dL (ref 0.3–1.2)
Total Protein: 7.4 g/dL (ref 6.5–8.1)

## 2019-05-15 LAB — CBC WITH DIFFERENTIAL/PLATELET
Abs Immature Granulocytes: 0.03 10*3/uL (ref 0.00–0.07)
Basophils Absolute: 0.1 10*3/uL (ref 0.0–0.1)
Basophils Relative: 1 %
Eosinophils Absolute: 0.1 10*3/uL (ref 0.0–0.5)
Eosinophils Relative: 1 %
HCT: 34.2 % — ABNORMAL LOW (ref 39.0–52.0)
Hemoglobin: 10.3 g/dL — ABNORMAL LOW (ref 13.0–17.0)
Immature Granulocytes: 0 %
Lymphocytes Relative: 20 %
Lymphs Abs: 1.5 10*3/uL (ref 0.7–4.0)
MCH: 34.3 pg — ABNORMAL HIGH (ref 26.0–34.0)
MCHC: 30.1 g/dL (ref 30.0–36.0)
MCV: 114 fL — ABNORMAL HIGH (ref 80.0–100.0)
Monocytes Absolute: 0.5 10*3/uL (ref 0.1–1.0)
Monocytes Relative: 6 %
Neutro Abs: 5.2 10*3/uL (ref 1.7–7.7)
Neutrophils Relative %: 72 %
Platelets: 220 10*3/uL (ref 150–400)
RBC: 3 MIL/uL — ABNORMAL LOW (ref 4.22–5.81)
RDW: 20 % — ABNORMAL HIGH (ref 11.5–15.5)
WBC: 7.4 10*3/uL (ref 4.0–10.5)
nRBC: 0 % (ref 0.0–0.2)

## 2019-05-15 LAB — URINALYSIS, ROUTINE W REFLEX MICROSCOPIC
Bilirubin Urine: NEGATIVE
Glucose, UA: NEGATIVE mg/dL
Hgb urine dipstick: NEGATIVE
Ketones, ur: NEGATIVE mg/dL
Leukocytes,Ua: NEGATIVE
Nitrite: NEGATIVE
Protein, ur: NEGATIVE mg/dL
Specific Gravity, Urine: 1.004 — ABNORMAL LOW (ref 1.005–1.030)
pH: 5 (ref 5.0–8.0)

## 2019-05-15 LAB — CBC
HCT: 35.3 % — ABNORMAL LOW (ref 39.0–52.0)
Hemoglobin: 10.7 g/dL — ABNORMAL LOW (ref 13.0–17.0)
MCH: 34.3 pg — ABNORMAL HIGH (ref 26.0–34.0)
MCHC: 30.3 g/dL (ref 30.0–36.0)
MCV: 113.1 fL — ABNORMAL HIGH (ref 80.0–100.0)
Platelets: 228 10*3/uL (ref 150–400)
RBC: 3.12 MIL/uL — ABNORMAL LOW (ref 4.22–5.81)
RDW: 19.9 % — ABNORMAL HIGH (ref 11.5–15.5)
WBC: 7.4 10*3/uL (ref 4.0–10.5)
nRBC: 0 % (ref 0.0–0.2)

## 2019-05-15 LAB — HEPATITIS PANEL, ACUTE
HCV Ab: NONREACTIVE
Hep A IgM: NONREACTIVE
Hep B C IgM: NONREACTIVE
Hepatitis B Surface Ag: NONREACTIVE

## 2019-05-15 LAB — LIPID PANEL
Cholesterol: 176 mg/dL (ref 0–200)
HDL: 74 mg/dL (ref >40)
LDL Cholesterol: 87 mg/dL (ref 0–99)
Total CHOL/HDL Ratio: 2.4 RATIO
Triglycerides: 77 mg/dL (ref <150)
VLDL: 15 mg/dL (ref 0–40)

## 2019-05-15 LAB — LACTIC ACID, PLASMA
Lactic Acid, Venous: 4.6 mmol/L (ref 0.5–1.9)
Lactic Acid, Venous: 2.3 mmol/L (ref 0.5–1.9)

## 2019-05-15 LAB — BRAIN NATRIURETIC PEPTIDE: B Natriuretic Peptide: 2123.1 pg/mL — ABNORMAL HIGH (ref 0.0–100.0)

## 2019-05-15 LAB — SARS CORONAVIRUS 2 (TAT 6-24 HRS): SARS Coronavirus 2: NEGATIVE

## 2019-05-15 LAB — HIV ANTIBODY (ROUTINE TESTING W REFLEX): HIV Screen 4th Generation wRfx: NONREACTIVE

## 2019-05-15 LAB — STREP PNEUMONIAE URINARY ANTIGEN: Strep Pneumo Urinary Antigen: NEGATIVE

## 2019-05-15 LAB — MAGNESIUM: Magnesium: 1.8 mg/dL (ref 1.7–2.4)

## 2019-05-15 LAB — D-DIMER, QUANTITATIVE: D-Dimer, Quant: 1.49 ug/mL-FEU — ABNORMAL HIGH (ref 0.00–0.50)

## 2019-05-15 LAB — CREATININE, SERUM
Creatinine, Ser: 0.76 mg/dL (ref 0.61–1.24)
GFR calc Af Amer: >60 mL/min (ref >60)
GFR calc non Af Amer: >60 mL/min (ref >60)

## 2019-05-15 LAB — TROPONIN I (HIGH SENSITIVITY)
Troponin I (High Sensitivity): 44 ng/L — ABNORMAL HIGH (ref <18)
Troponin I (High Sensitivity): 55 ng/L — ABNORMAL HIGH (ref <18)

## 2019-05-15 LAB — PROCALCITONIN: Procalcitonin: <0.1 ng/mL

## 2019-05-15 LAB — PHOSPHORUS: Phosphorus: 3.7 mg/dL (ref 2.5–4.6)

## 2019-05-15 LAB — LIPASE, BLOOD: Lipase: 22 U/L (ref 11–51)

## 2019-05-15 LAB — VITAMIN B12: Vitamin B-12: 152 pg/mL — ABNORMAL LOW (ref 180–914)

## 2019-05-15 MED ORDER — IPRATROPIUM-ALBUTEROL 0.5-2.5 (3) MG/3ML IN SOLN
3.0000 mL | RESPIRATORY_TRACT | Status: DC | PRN
  Administered 2019-05-15: 3 mL via RESPIRATORY_TRACT
  Filled 2019-05-15: qty 3

## 2019-05-15 MED ORDER — ENOXAPARIN SODIUM 40 MG/0.4ML ~~LOC~~ SOLN
40.0000 mg | SUBCUTANEOUS | Status: DC
  Administered 2019-05-15 – 2019-05-20 (×6): 40 mg via SUBCUTANEOUS
  Filled 2019-05-15 (×6): qty 0.4

## 2019-05-15 MED ORDER — SODIUM CHLORIDE 0.9 % IV SOLN
2.0000 g | Freq: Three times a day (TID) | INTRAVENOUS | Status: DC
  Administered 2019-05-15 – 2019-05-16 (×2): 2 g via INTRAVENOUS
  Filled 2019-05-15 (×4): qty 2

## 2019-05-15 MED ORDER — AMLODIPINE BESYLATE 5 MG PO TABS
10.0000 mg | ORAL_TABLET | Freq: Every day | ORAL | Status: DC
  Administered 2019-05-15: 10 mg via ORAL
  Filled 2019-05-15: qty 2

## 2019-05-15 MED ORDER — IOHEXOL 350 MG/ML SOLN
75.0000 mL | Freq: Once | INTRAVENOUS | Status: AC | PRN
  Administered 2019-05-15: 75 mL via INTRAVENOUS

## 2019-05-15 MED ORDER — FUROSEMIDE 10 MG/ML IJ SOLN
40.0000 mg | Freq: Once | INTRAMUSCULAR | Status: AC
  Administered 2019-05-15: 40 mg via INTRAVENOUS
  Filled 2019-05-15: qty 4

## 2019-05-15 MED ORDER — VANCOMYCIN HCL IN DEXTROSE 1-5 GM/200ML-% IV SOLN
1000.0000 mg | Freq: Once | INTRAVENOUS | Status: AC
  Administered 2019-05-15: 1000 mg via INTRAVENOUS
  Filled 2019-05-15: qty 200

## 2019-05-15 MED ORDER — ONDANSETRON HCL 4 MG PO TABS: 4.0000 mg | ORAL_TABLET | Freq: Four times a day (QID) | ORAL | Status: DC | PRN

## 2019-05-15 MED ORDER — POTASSIUM CHLORIDE 20 MEQ PO PACK
40.0000 meq | PACK | Freq: Once | ORAL | Status: AC
  Administered 2019-05-15: 40 meq via ORAL
  Filled 2019-05-15 (×2): qty 2

## 2019-05-15 MED ORDER — VANCOMYCIN HCL 750 MG/150ML IV SOLN
750.0000 mg | INTRAVENOUS | Status: DC
  Administered 2019-05-16: 750 mg via INTRAVENOUS
  Filled 2019-05-15 (×2): qty 150

## 2019-05-15 MED ORDER — FUROSEMIDE 10 MG/ML IJ SOLN
40.0000 mg | Freq: Two times a day (BID) | INTRAMUSCULAR | Status: DC
  Administered 2019-05-15 – 2019-05-16 (×3): 40 mg via INTRAVENOUS
  Filled 2019-05-15 (×3): qty 4

## 2019-05-15 MED ORDER — ACETAMINOPHEN 325 MG PO TABS: 650.0000 mg | ORAL_TABLET | Freq: Four times a day (QID) | ORAL | Status: DC | PRN

## 2019-05-15 MED ORDER — THIAMINE HCL 100 MG PO TABS
100.0000 mg | ORAL_TABLET | Freq: Every day | ORAL | Status: DC
  Administered 2019-05-15 – 2019-05-17 (×3): 100 mg via ORAL
  Filled 2019-05-15 (×4): qty 1

## 2019-05-15 MED ORDER — ADULT MULTIVITAMIN W/MINERALS CH
1.0000 | ORAL_TABLET | Freq: Every day | ORAL | Status: DC
  Administered 2019-05-15 – 2019-05-17 (×3): 1 via ORAL
  Filled 2019-05-15 (×4): qty 1

## 2019-05-15 MED ORDER — NITROGLYCERIN 0.4 MG SL SUBL
0.4000 mg | SUBLINGUAL_TABLET | SUBLINGUAL | Status: DC | PRN
  Administered 2019-05-15: 0.4 mg via SUBLINGUAL
  Filled 2019-05-15: qty 1

## 2019-05-15 MED ORDER — ACETAMINOPHEN 650 MG RE SUPP: 650.0000 mg | Freq: Four times a day (QID) | RECTAL | Status: DC | PRN

## 2019-05-15 MED ORDER — FOLIC ACID 1 MG PO TABS
1.0000 mg | ORAL_TABLET | Freq: Every day | ORAL | Status: DC
  Administered 2019-05-15 – 2019-05-20 (×5): 1 mg via ORAL
  Filled 2019-05-15 (×6): qty 1

## 2019-05-15 MED ORDER — SODIUM CHLORIDE 0.9 % IV SOLN
2.0000 g | Freq: Once | INTRAVENOUS | Status: AC
  Administered 2019-05-15: 2 g via INTRAVENOUS
  Filled 2019-05-15: qty 2

## 2019-05-15 MED ORDER — LABETALOL HCL 5 MG/ML IV SOLN
10.0000 mg | Freq: Once | INTRAVENOUS | Status: AC
  Administered 2019-05-15: 10 mg via INTRAVENOUS
  Filled 2019-05-15: qty 4

## 2019-05-15 MED ORDER — LABETALOL HCL 5 MG/ML IV SOLN: 10.0000 mg | Freq: Four times a day (QID) | INTRAVENOUS | Status: DC | PRN

## 2019-05-15 MED ORDER — ONDANSETRON HCL 4 MG/2ML IJ SOLN: 4.0000 mg | Freq: Four times a day (QID) | INTRAMUSCULAR | Status: DC | PRN

## 2019-05-15 MED ORDER — SODIUM CHLORIDE 0.9 % IV SOLN
1.0000 g | Freq: Once | INTRAVENOUS | Status: DC
  Filled 2019-05-15: qty 1

## 2019-05-15 MED ORDER — LOSARTAN POTASSIUM 50 MG PO TABS
50.0000 mg | ORAL_TABLET | Freq: Every day | ORAL | Status: DC
  Administered 2019-05-15 – 2019-05-17 (×3): 50 mg via ORAL
  Filled 2019-05-15 (×4): qty 1

## 2019-05-15 NOTE — Progress Notes (Addendum)
Pharmacy Antibiotic Note  Allen Saunders is a 59 y.o. male admitted on 05/15/2019 with pneumonia.  Pharmacy has been consulted for vancomycin and cefepime dosing. Pt is afebrile and WBC is WNL. SCr is WNL but lactic acid is elevated.   Plan: Vancomycin 1gm IV x 1 then 750mg  IV Q24H Cefepime 2gm IV Q8H F/u renal fxn, C&S, clinical status and peak/trough at SS  Height: 6' 5.5" (196.9 cm) Weight: 52.6 kg (116 lb) IBW/kg (Calculated) : 90.25  Temp (24hrs), Avg:97.6 F (36.4 C), Min:97.6 F (36.4 C), Max:97.6 F (36.4 C)  Recent Labs  Lab 05/15/19 0816 05/15/19 1026  WBC 7.4  --   CREATININE 0.72  --   LATICACIDVEN 4.6* 2.3*    Estimated Creatinine Clearance: 74.9 mL/min (by C-G formula based on SCr of 0.72 mg/dL).    Allergies  Allergen Reactions  . Asa [Aspirin] Other (See Comments)    Sore throat, gi upset    Antimicrobials this admission: Vanc 4/5>> Cefepime 4/5>>  Dose adjustments this admission: N/A  Microbiology results: Pending  Thank you for allowing pharmacy to be a part of this patient's care.  Allen Saunders, 07/15/19 05/15/2019 12:19 PM

## 2019-05-15 NOTE — Consult Note (Signed)
Cardiology Consultation:   Patient ID: Allen Saunders; [id number]; 06-May-1960   Admit date: 05/15/2019 Date of Consult: 05/15/2019  Primary Care Provider: [name], MD Primary Cardiologist: New to [facility]  Patient Profile:   Allen Saunders is a 59 y.o. male with a hx of hypertension, thoracic aortic aneurysm, aortic root aneurysm, tobacco and alcohol abuse who is being seen today for the evaluation of acute CHF at the request of Dr Jacqulyn Bath.  History of Present Illness:   Allen Saunders is a 59 year old male with a history stated above who presented to [facility] on 05/15/2019 with complaints of shortness of breath and lower extremity edema for the last 1 month however has acutely worsened in the last week. Pt reports he has noticed that his shortness of breath has worsened to the point he can no longer ambulate from one side of his house to another without becoming acutely dyspneic. He has 2-3 pillow orthopnea symptoms however denies chest pain symptoms. He states that approximately 6 weeks ago he was diagnosed with CAP PNA and was treated with outpatient oral antibiotics. On CTA this admission there is a new large right upper lobe opacity concerning for cavitating pneumonia measuring 5.2 x 3.1 cm with recommendations for follow-up CT scan in 2 to 3 weeks after treatment to ensure resolution and rule out underlying malignancy. He also reports unintentional weight loss from 187lb to 116lb in 1 year duration which is concerning given the above.  Does have a history of tobacco abuse.  He states that his weight loss is secondary to poor oral intake and difficulty with swallowing however states that this is resolving.  His cough has been so bad that he now has cracked ribs which is caused him to have some chest pain associated with deep inspiration and coughing.  Does have a significant history of tobacco and EtOH abuse.  In the ED, patient was found to be tachycardic and mildly hypoxic requiring 2 3 L of nasal  cannula.  Lactic acid was elevated at 4.6 however this trended down to 2.3 on repeat lab work.  There is no leukocytosis.  BNP is elevated 2123.  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  He was started on IV vancomycin and cefepime as well as IV Lasix 40 mg.  Our service 05/03/2011 during a hospital admission for chest pain.  He underwent LHC at that time which showed normal left main, normal LAD, normal LCx and normal RCA, dominant.  EF was 60% by visual estimate.  ED 2 uncontrolled hypertension.  CXR at that time did show left lower lobe pulmonary nodule.  Cardiology has been asked to evaluate given the above and symptoms consistent with acute cardiomyopathy.  Past Medical History:  Diagnosis Date  . Anxiety   . Chest pain, with abnormal EKG 10/03/2011  . Hx of echocardiogram 12/02/2016  . Hypertension   . 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 Panic attack   . Substance abuse (HCC)    ETOH  . Thoracic aortic aneurysm, without rupture (HCC)    NOTED 06/09/2016 on ECHO    Past Surgical History:  Procedure Laterality Date  . COLONOSCOPY  11/13/2013   h/o diarrhea, diverticula, large hemorrhoids  . KNEE ARTHROSCOPY W/ MENISCAL REPAIR Left    X2 in the 80's  . KNEE ARTHROSCOPY W/ MENISCAL REPAIR Right  x 2 in the 80's  . LEFT HEART CATHETERIZATION WITH CORONARY ANGIOGRAM N/A 10/03/2011   Procedure: LEFT HEART CATHETERIZATION WITH CORONARY ANGIOGRAM;  Surgeon: [name], MD;  Location: [facility] CATH LAB;  Service: Cardiovascular;  Laterality: N/A;     Prior to Admission medications   Medication Sig Start Date End Date Taking? Authorizing Provider  amLODipine (NORVASC) 10 MG tablet Take 10 mg by mouth daily.   Yes [provider]  cholecalciferol (VITAMIN D3) 25 MCG (1000 UNIT) tablet Take 1,000 Units by mouth daily.   Yes [provider]  Lidocaine (HM LIDOCAINE PATCH) 4 % PTCH Apply 1 patch topically daily as needed (Back pain).   Yes [provider]  thiamine (VITAMIN B-1) 50 MG tablet Take 50 mg by mouth 2 (two) times daily.   Yes [provider]  triamcinolone cream (KENALOG) 0.1 % Apply 1 application topically 2 (two) times daily. Patient not taking: Reported on 05/15/2019 07/22/18   Tanda Rockers, PA-C    Inpatient Medications: Scheduled Meds: . amLODipine  10 mg Oral Daily  . enoxaparin (LOVENOX) injection  40 mg Subcutaneous Q24H  . folic acid  1 mg Oral Daily  . [START ON 05/16/2019] furosemide  40 mg Intravenous Q12H  . multivitamin with minerals  1 tablet Oral Daily  . potassium chloride  40 mEq Oral Once  . thiamine  100 mg Oral Daily   Continuous Infusions: . ceFEPime (MAXIPIME) IV    . [START ON 05/16/2019] vancomycin     PRN Meds: acetaminophen **OR** acetaminophen, ipratropium-albuterol, labetalol, nitroGLYCERIN, ondansetron **OR** ondansetron (ZOFRAN) IV  Allergies:    Allergies  Allergen Reactions  . Asa [Aspirin] Other (See Comments)    Sore throat, gi upset    Social History:   Social History   Socioeconomic History  . Marital status: Married    Spouse name: Not on file  . Number of children: Not on file  . Years of education: Not on file  . Highest education level: Not on file  Occupational History  . Not on file  Tobacco Use  . Smoking status: Current Every Day Smoker    Packs/day: 1.00    Types: Cigarettes    Start date: 02/09/1978  . Smokeless tobacco: Never Used  Substance and Sexual Activity  . Alcohol use: Yes    Comment: 12 216 oz beers per day  . Drug use: No  . Sexual activity: Yes  Other Topics Concern  . Not on file  Social History Narrative  . Not on file   Social Determinants of Health   Financial Resource Strain:   . Difficulty of Paying Living Expenses:   Food Insecurity:   . Worried About Programme researcher, broadcasting/film/video in the Last Year:    . Barista in the Last Year:   Transportation Needs:   . Freight forwarder (Medical):   Marland Kitchen Lack of Transportation (Non-Medical):   Physical Activity:   . Days of Exercise per Week:   . Minutes of Exercise per Session:   Stress:   . Feeling of Stress :   Social Connections:   . Frequency of Communication with Friends and Family:   . Frequency of Social Gatherings with Friends and Family:   . Attends Religious Services:   . Active Member of Clubs or Organizations:   . Attends Banker Meetings:   Marland Kitchen Marital Status:   Intimate Partner Violence:   . Fear of Current or Ex-Partner:   .  Emotionally Abused:   Marland Kitchen Physically Abused:   . Sexually Abused:     Family History:   Family History  Problem Relation Age of Onset  . Coronary artery disease Father    Family Status:  Family Status  Relation Name Status  . Mother  Alive  . Father  Deceased    ROS:  Please see the history of present illness.  All other ROS reviewed and negative.     Physical Exam/Data:   Vitals:   05/15/19 1445 05/15/19 1500 05/15/19 1515 05/15/19 1530  BP: (!) 150/108 (!) 143/107 (!) 143/105 (!) 140/102  Pulse: (!) 122 (!) 121 (!) 123 (!) 125  Resp: (!) 29 (!) 30 (!) 26 (!) 23  Temp:      TempSrc:      SpO2: 100% 99% 100% 100%  Weight:      Height:        Intake/Output Summary (Last 24 hours) at 05/15/2019 1549 Last data filed at 05/15/2019 1544 Gross per 24 hour  Intake 300 ml  Output 1000 ml  Net -700 ml   Filed Weights   05/15/19 0806  Weight: 52.6 kg   Body mass index is 13.58 kg/m.   General: Frail, cachectic, NAD Skin: Warm, dry, intact  Head: Normocephalic, atraumatic, clear, moist mucus membranes. Neck: Negative for carotid bruits. No JVD Lungs: Crackles bilateral lower lung fields. No wheezes. Breathing is unlabored. Cardiovascular: RRR with S1 S2. No murmurs Abdomen: Soft, non-tender, non-distended. No obvious abdominal masses. Extremities: 3+ BLE edema.   Radial pulses 2+ bilaterally Neuro: Alert and oriented. No focal deficits. No facial asymmetry. MAE spontaneously. Psych: Responds to questions appropriately with normal affect.    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 Telemetry:  Telemetry was personally reviewed and demonstrates: 05/15/2019 sinus tachycardia with rates in the 120 range  Relevant CV Studies:  ECHO: 12/10/2016:  Scanned document: LVEF at 55 to 60% with grade 2 DD.  There was evidence of moderate aortic root dilation at 4.3 cm at that time.  CATH: 10/03/2011:  1. Left main; normal  2. LAD; normal 3. Left circumflex; normal and nondominant.  4. Right coronary artery; normal and dominant 5. Left ventriculography; RAO left ventriculogram was performed using  25 cc of Visipaque dye at 12 cc per second. There were no wall motion abnormalities.the EF was estimated visually at 60%.  IMPRESSION:Allen Saunders has normal coronary arteries and normal left ventricular function. Believe his chest pain is chest wall from coughing and his EKG or represents early repolarization pattern. Continued medical therapy will be recommended. The sheath was removed and it a TR band is placed on the right wrist chief patent hemostasis. The patient left the Cath Lab in stable condition. Initially hydrated and discharged home in the morning.  Laboratory Data:  Chemistry Recent Labs  Lab 05/15/19 0816 05/15/19 1413  NA 139  --   K 3.4*  --   CL 105  --   CO2 18*  --   GLUCOSE 92  --   BUN 6  --   CREATININE 0.72 0.76  CALCIUM 8.9  --   GFRNONAA >60 >60  GFRAA >60 >60  ANIONGAP 16*  --     Total Protein  Date Value Ref Range Status  05/15/2019 7.4 6.5 - 8.1 g/dL Final   Albumin  Date Value Ref Range Status  05/15/2019 2.7 (L)  3.5 - 5.0 g/dL Final   AST  Date Value Ref Range Status  05/15/2019 19 15 - 41 U/L Final   ALT  Date Value Ref  Range Status  05/15/2019 11 0 - 44 U/L Final   Alkaline Phosphatase  Date Value Ref Range Status  05/15/2019 75 38 - 126 U/L Final   Total Bilirubin  Date Value Ref Range Status  05/15/2019 0.7 0.3 - 1.2 mg/dL Final   Hematology Recent Labs  Lab 05/15/19 0816 05/15/19 1413  WBC 7.4 7.4  RBC 3.00* 3.12*  HGB 10.3* 10.7*  HCT 34.2* 35.3*  MCV 114.0* 113.1*  MCH 34.3* 34.3*  MCHC 30.1 30.3  RDW 20.0* 19.9*  PLT 220 228   Cardiac EnzymesNo results for input(s): TROPONINI in the last 168 hours. No results for input(s): TROPIPOC in the last 168 hours.  BNP Recent Labs  Lab 05/15/19 0816  BNP 2,123.1*    DDimer  Recent Labs  Lab 05/15/19 0816  DDIMER 1.49*   TSH:  Lab Results  Component Value Date   TSH 2.063 05/15/2019   Lipids: Lab Results  Component Value Date   CHOL 176 05/15/2019   HDL 74 05/15/2019   LDLCALC 87 05/15/2019   TRIG 77 05/15/2019   CHOLHDL 2.4 05/15/2019   HgbA1c: Lab Results  Component Value Date   HGBA1C 5.3 10/03/2011    Radiology/Studies:  CT Angio Chest PE W and/or Wo Contrast  Result Date: 05/15/2019 CLINICAL DATA:  Shortness of breath. EXAM: CT ANGIOGRAPHY CHEST WITH CONTRAST TECHNIQUE: Multidetector CT imaging of the chest was performed using the standard protocol during bolus administration of intravenous contrast. Multiplanar CT image reconstructions and MIPs were obtained to evaluate the vascular anatomy. CONTRAST:  75mL OMNIPAQUE IOHEXOL 350 MG/ML SOLN COMPARISON:  December 07, 2018. FINDINGS: Cardiovascular: Satisfactory opacification of the pulmonary arteries to the segmental level. No evidence of pulmonary embolism. Normal heart size. No pericardial effusion. Atherosclerosis of thoracic aorta is noted. 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 Mediastinum/Nodes: No enlarged mediastinal, hilar, or axillary lymph nodes. Thyroid gland, trachea, and esophagus demonstrate no  significant findings. Lungs/Pleura: No pneumothorax is noted. Small bilateral pleural effusions are noted. Mild bibasilar subsegmental atelectasis is noted. Mild emphysematous disease is noted in both upper lobes. There is new large right upper lobe opacity concerning for cavitating pneumonia measuring 5.2 x 3.1 cm. Upper Abdomen: No acute abnormality. Musculoskeletal: No chest wall abnormality. No acute or significant osseous findings. Review of the MIP images confirms the above findings. IMPRESSION: 1. No definite evidence of pulmonary embolus. 2. Small bilateral pleural effusions are noted with mild bibasilar subsegmental atelectasis. 3. Large right upper lobe opacity is noted concerning for cavitating pneumonia. Follow-up CT scan in 2-3 weeks after treatment is recommended to ensure resolution and rule out underlying malignancy. 4. 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 Ascending thoracic aortic aneurysm. Recommend semi-annual imaging followup by CTA or MRA and referral to cardiothoracic surgery if not already obtained. This recommendation follows 2010 ACCF/AHA/AATS/ACR/ASA/SCA/SCAI/SIR/STS/SVM Guidelines for the Diagnosis and Management of Patients With Thoracic Aortic Disease. Circulation. 2010; 121: Z610-R604. Aortic aneurysm NOS (ICD10-I71.9). 5. Aortic root is aneurysmal measuring 5.1 cm in diameter. Aortic Atherosclerosis (ICD10-I70.0) and Emphysema (ICD10-J43.9). Electronically Signed   By: Lupita Raider M.D.   On: 05/15/2019 11:08   DG Chest Portable 1 View  Result Date: 05/15/2019 CLINICAL DATA:  Cough shortness of breath bilateral leg swelling EXAM: PORTABLE CHEST  1 VIEW COMPARISON:  07/22/2018 FINDINGS: Cardiomediastinal contours are stable. Hilar structures are unremarkable. Increased opacity in the left lung base and at the right lung base compared to the previous study. Crescentic area of increased density over the right lung apex. This is new from 07/22/2018 Subacute rib  fractures along the right lateral chest. Also with evidence of prior rib fractures on the left similar to CT of 12/07/2018. Visualized skeletal structures are otherwise unremarkable. IMPRESSION: 1. Increased opacity in the left lung base and at the right lung base compared to the previous study which may represent developing pneumonia. 2. Crescentic area of increased density over the right lung apex which is new from 07/22/2018. Findings are of uncertain significance potentially related to pleural thickening or loculated pleural fluid. CT of the chest may be helpful for further assessment. 3. Bilateral rib fractures similar to prior CT of 12/07/2018 4. Emphysema as before. Electronically Signed   By: Zetta Bills M.D.   On: 05/15/2019 08:21   VAS [place] LOWER EXTREMITY VENOUS (DVT) (ONLY [facility] & [facility])  Result Date: 05/15/2019  Lower Venous DVTStudy Indications: Edema, and Swelling.  Comparison Study: No priors. Performing Technologist: Oda Cogan RDMS, RVT  Examination Guidelines: A complete evaluation includes B-mode imaging, spectral Doppler, color Doppler, and power Doppler as needed of all accessible portions of each vessel. Bilateral testing is considered an integral part of a complete examination. Limited examinations for reoccurring indications may be performed as noted. The reflux portion of the exam is performed with the patient in reverse Trendelenburg.  +---------+---------------+---------+-----------+----------+--------------+ RIGHT    CompressibilityPhasicitySpontaneityPropertiesThrombus Aging +---------+---------------+---------+-----------+----------+--------------+ CFV      Full           Yes      Yes                                 +---------+---------------+---------+-----------+----------+--------------+ SFJ      Full                                                        +---------+---------------+---------+-----------+----------+--------------+ FV Prox  Full                                                         +---------+---------------+---------+-----------+----------+--------------+ FV Mid   Full                                                        +---------+---------------+---------+-----------+----------+--------------+ FV DistalFull                                                        +---------+---------------+---------+-----------+----------+--------------+ PFV      Full                                                        +---------+---------------+---------+-----------+----------+--------------+  POP      Full           Yes      Yes                                 +---------+---------------+---------+-----------+----------+--------------+ PTV      Full                                                        +---------+---------------+---------+-----------+----------+--------------+ PERO     Full                                                        +---------+---------------+---------+-----------+----------+--------------+   +---------+---------------+---------+-----------+----------+--------------+ LEFT     CompressibilityPhasicitySpontaneityPropertiesThrombus Aging +---------+---------------+---------+-----------+----------+--------------+ CFV      Full           Yes      Yes                                 +---------+---------------+---------+-----------+----------+--------------+ SFJ      Full                                                        +---------+---------------+---------+-----------+----------+--------------+ FV Prox  Full                                                        +---------+---------------+---------+-----------+----------+--------------+ FV Mid   Full                                                        +---------+---------------+---------+-----------+----------+--------------+ FV DistalFull                                                         +---------+---------------+---------+-----------+----------+--------------+ PFV      Full                                                        +---------+---------------+---------+-----------+----------+--------------+ POP      Full           Yes      Yes                                 +---------+---------------+---------+-----------+----------+--------------+  PTV      Full                                                        +---------+---------------+---------+-----------+----------+--------------+ PERO     Full                                                        +---------+---------------+---------+-----------+----------+--------------+     Summary: RIGHT: - There is no evidence of deep vein thrombosis in the lower extremity.  - No cystic structure found in the popliteal fossa.  LEFT: - There is no evidence of deep vein thrombosis in the lower extremity.  - No cystic structure found in the popliteal fossa.  *See table(s) above for measurements and observations.    Preliminary     Assessment and Plan:   1.  Presumed acute systolic CHF: -Patient presented to [facility] on [facility]2019 with complaints of shortness of breath and lower extremity edema for the last 1 month however has acutely worsened in the last week. Reports he has noticed that his shortness of breath has worsened to the point he can no longer ambulate from one side of his house to another without becoming acutely dyspneic. He has 2-3 pillow orthopnea symptoms however denies chest pain symptoms. He states that approximately 6 weeks ago he was diagnosed with CAP PNA and was treated with outpatient oral antibiotics. On CTA this admission there is a new large right upper lobe opacity concerning for cavitating pneumonia measuring 5.2 x 3.1 cm with recommendations for follow-up CT scan in 2 to 3 weeks after treatment to ensure resolution and rule out underlying malignancy. -CXR with increased opacity in the left lung and  right lung base compared to previous imaging representing developing PNA -BNP on presentation >2000 with LE edema and orthopnea symptoms consistent with presumed systolic dysfunction -Echocardiogram ordered by primary admitting team with pending results -HST found to be mildly elevated at 44 with repeat at 55 not necessarily consistent with ACS given significant fluid volume overload and presumed systolic CHF -Started on IV Lasix 40 mg twice daily>> continue -We will stop amlodipine and start losartan 50 mg p.o. daily -No beta-blocker at this time given suspected acute systolic dysfunction with fluid volume overload -Weight, 119lb  -I&O, net -700 mL since ED admission  2. 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 daily -May need further titration  3.  Unintentional weight loss: -Pt reports unintentional weight loss over the last year>>185lb down to 116lb  -He states he has had poor oral intake secondary to difficulty swallowing however concerning given CAP PNA treated with outpatient oral antibiotics for 6 weeks without resolution.  CTA performed during this hospitalization with right upper lobe opacity concerning for cavitating pneumonia measuring 5.2  x 3.1 cm with recommendations for follow-up CT scan in 2 to 3 weeks after treatment to ensure resolution and rule out underlying lung malignancy. -Albumin, 2.7 -TSH found to be 2.063>>WNL  -Management per primary team  4. HTN: -Patient found to be hypertensive on admission -On PTA amlodipine 10mg  PO QD -Will stop amlodipine and start  losartan 50mg  PO QD  -Monitor BP closely. May need further antihypertensive titration  5.  Tobacco and alcohol abuse: -Smoking cessation strongly encouraged -Reports 8-12 beers per day -CIWA protocol per primary team  Other problems per primary team include: -Lactic acidosis -Microcytic anemia -Acute hypoxic respiratory failure   For questions or updates, please contact [facility] Please consult [email or web address] for contact info under Cardiology/STEMI.   Signed NP-[facility] Pager: [phone] 05/15/2019 3:49 PM

## 2019-05-15 NOTE — Progress Notes (Signed)
Bilateral lower ext venous  has been completed. Refer to [facility] under chart review to view preliminary results.   05/15/2019  9:50 AM Pedram Goodchild, Gerarda Gunther

## 2019-05-15 NOTE — ED Notes (Signed)
MD notified of elevated lactic acid.

## 2019-05-15 NOTE — ED Notes (Signed)
Cardiology at bedside.

## 2019-05-15 NOTE — ED Provider Notes (Signed)
[facility] EMERGENCY DEPARTMENT Provider Note   CSN: [id number] Arrival date & time: 05/15/19  0747     History No chief complaint on file.   Allen Saunders is a 59 y.o. male.  The history is provided by the patient. No language interpreter was used.  Shortness of Breath Severity:  Severe Onset quality:  Gradual Duration:  2 weeks Timing:  Constant Progression:  Worsening Chronicity:  New Context: not URI   Relieved by:  Nothing Worsened by:  Nothing Ineffective treatments:  None tried Associated symptoms: cough and sputum production   Associated symptoms: no abdominal pain, no chest pain, no claudication, no diaphoresis, no fever, no headaches, no hemoptysis, no neck pain, no sore throat, no vomiting and no wheezing   Risk factors: no hx of PE/DVT        Past Medical History:  Diagnosis Date  . Anxiety   . Chest pain, with abnormal EKG 10/03/2011  . Hx of echocardiogram 12/02/2016  . Hypertension   . 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 Panic attack   . Substance abuse (HCC)    ETOH  . Thoracic aortic aneurysm, without rupture (HCC)    NOTED 06/09/2016 on ECHO    Patient Active Problem List   Diagnosis Date Noted  . Substance abuse (HCC)   . Thoracic aortic aneurysm, without rupture (HCC)   . Hx of echocardiogram 12/02/2016  . Chest pain, with abnormal EKG, non cardiac, patent cors. on cath 10/03/11, pain due to uncontrolled HTN and coughing 10/03/2011  .  HTN uncontrolled 10/03/2011    Past Surgical History:  Procedure Laterality Date  . COLONOSCOPY  11/13/2013   h/o diarrhea, diverticula, large hemorrhoids  . KNEE ARTHROSCOPY W/ MENISCAL REPAIR Left    X2 in the 80's  . KNEE ARTHROSCOPY W/ MENISCAL REPAIR Right    x 2 in the 80's  . LEFT HEART CATHETERIZATION WITH CORONARY ANGIOGRAM N/A 10/03/2011   Procedure: LEFT HEART CATHETERIZATION WITH CORONARY ANGIOGRAM;  Surgeon: [name], MD;  Location: [facility] CATH LAB;  Service: Cardiovascular;   Laterality: N/A;       Family History  Problem Relation Age of Onset  . Coronary artery disease Father     Social History   Tobacco Use  . Smoking status: Current Every Day Smoker    Packs/day: 1.00    Types: Cigarettes    Start date: 02/09/1978  . Smokeless tobacco: Never Used  Substance Use Topics  . Alcohol use: Yes    Comment: beer daily  . Drug use: No    Home Medications Prior to Admission medications   Medication Sig Start Date End Date Taking? Authorizing Provider  amLODipine (NORVASC) 10 MG tablet Take 10 mg by mouth daily.    [provider]  Cholecalciferol (VITAMIN D3 PO) Take 2,000 Units by mouth daily.    [provider]  oxyCODONE-acetaminophen (PERCOCET/ROXICET) 5-325 MG tablet Take 1 tablet by mouth every 6 (six) hours as needed for severe pain. 07/22/18   Tanda Rockers, PA-C  thiamine 50 MG tablet Take 100 mg by mouth daily.    [provider]  triamcinolone cream (KENALOG) 0.1 % Apply 1 application topically 2 (two) times daily. 07/22/18   Tanda Rockers, PA-C    Allergies    Asa [aspirin]  Review of Systems   Review of Systems  Constitutional: Positive for chills and fatigue. Negative for diaphoresis and fever.  HENT: Negative for congestion and sore throat.   Eyes: Negative for  visual disturbance.  Respiratory: Positive for cough, sputum production and shortness of breath. Negative for hemoptysis, choking, chest tightness, wheezing and stridor.   Cardiovascular: Positive for leg swelling. Negative for chest pain, palpitations and claudication.  Gastrointestinal: Negative for abdominal pain, constipation, diarrhea, nausea and vomiting.  Genitourinary: Negative for dysuria and flank pain.  Musculoskeletal: Negative for back pain, neck pain and neck stiffness.  Neurological: Negative for light-headedness, numbness and headaches.  Psychiatric/Behavioral: Negative for agitation.  All other systems reviewed and are  negative.   Physical Exam Updated Vital Signs BP (!) 142/118   Pulse (!) 123   Temp 97.6 F (36.4 C) (Oral)   Resp (!) 26   Ht 6' 5.5" (1.969 m)   Wt 52.6 kg   SpO2 95%   BMI 13.58 kg/m   Physical Exam Vitals and nursing note reviewed.  Constitutional:      General: He is not in acute distress.    Appearance: He is well-developed. He is not ill-appearing, toxic-appearing or diaphoretic.  HENT:     Head: Normocephalic and atraumatic.     Mouth/Throat:     Mouth: Mucous membranes are moist.  Eyes:     Extraocular Movements: Extraocular movements intact.     Conjunctiva/sclera: Conjunctivae normal.     Pupils: Pupils are equal, round, and reactive to light.  Cardiovascular:     Rate and Rhythm: Regular rhythm. Tachycardia present.     Heart sounds: No murmur.  Pulmonary:     Effort: Pulmonary effort is normal. No respiratory distress.     Breath sounds: Rhonchi and rales present.  Chest:     Chest wall: No tenderness.  Abdominal:     Palpations: Abdomen is soft.     Tenderness: There is no abdominal tenderness.  Musculoskeletal:     Cervical back: Normal range of motion and neck supple.     Right lower leg: No tenderness. Edema present.     Left lower leg: No tenderness. Edema present.  Skin:    General: Skin is warm and dry.     Capillary Refill: Capillary refill takes less than 2 seconds.     Findings: No erythema.  Neurological:     General: No focal deficit present.     Mental Status: He is alert.  Psychiatric:        Mood and Affect: Mood normal.     ED Results / Procedures / Treatments   Labs (all labs ordered are listed, but only abnormal results are displayed) Labs Reviewed  CBC WITH DIFFERENTIAL/PLATELET - Abnormal; Notable for the following components:      Result Value   RBC 3.00 (*)    Hemoglobin 10.3 (*)    HCT 34.2 (*)    MCV 114.0 (*)    MCH 34.3 (*)    RDW 20.0 (*)    All other components within normal limits  COMPREHENSIVE METABOLIC  PANEL - Abnormal; Notable for the following components:   Potassium 3.4 (*)    CO2 18 (*)    Albumin 2.7 (*)    Anion gap 16 (*)    All other components within normal limits  LACTIC ACID, PLASMA - Abnormal; Notable for the following components:   Lactic Acid, Venous 4.6 (*)    All other components within normal limits  LACTIC ACID, PLASMA - Abnormal; Notable for the following components:   Lactic Acid, Venous 2.3 (*)    All other components within normal limits  BRAIN NATRIURETIC PEPTIDE - Abnormal; Notable  for the following components:   B Natriuretic Peptide 2,123.1 (*)    All other components within normal limits  D-DIMER, QUANTITATIVE (NOT AT [facility]) - Abnormal; Notable for the following components:   D-Dimer, Quant 1.49 (*)    All other components within normal limits  URINALYSIS, ROUTINE W REFLEX MICROSCOPIC - Abnormal; Notable for the following components:   Color, Urine STRAW (*)    Specific Gravity, Urine 1.004 (*)    All other components within normal limits  TROPONIN I (HIGH SENSITIVITY) - Abnormal; Notable for the following components:   Troponin I (High Sensitivity) 44 (*)    All other components within normal limits  TROPONIN I (HIGH SENSITIVITY) - Abnormal; Notable for the following components:   Troponin I (High Sensitivity) 55 (*)    All other components within normal limits  CULTURE, BLOOD (ROUTINE X 2)  CULTURE, BLOOD (ROUTINE X 2)  URINE CULTURE  LIPASE, BLOOD    EKG EKG Interpretation  Date/Time:  Monday May 15 2019 08:14:59 EDT Ventricular Rate:  107 PR Interval:    QRS Duration: 65 QT Interval:  343 QTC Calculation: 458 R Axis:   79 Text Interpretation: Sinus tachycardia Probable left atrial enlargement Anterior infarct, old Borderline T abnormalities, inferior leads When compard to prior, faster rate. No STEMI Confirmed by Theda Belfast ([id number]) on 05/15/2019 8:43:11 AM   Radiology CT Angio Chest PE W and/or Wo Contrast  Result Date:  05/15/2019 CLINICAL DATA:  Shortness of breath. EXAM: CT ANGIOGRAPHY CHEST WITH CONTRAST TECHNIQUE: Multidetector CT imaging of the chest was performed using the standard protocol during bolus administration of intravenous contrast. Multiplanar CT image reconstructions and MIPs were obtained to evaluate the vascular anatomy. CONTRAST:  35mL OMNIPAQUE IOHEXOL 350 MG/ML SOLN COMPARISON:  December 07, 2018. FINDINGS: Cardiovascular: Satisfactory opacification of the pulmonary arteries to the segmental level. No evidence of pulmonary embolism. Normal heart size. No pericardial effusion. Atherosclerosis of thoracic aorta is noted. 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 Mediastinum/Nodes: No enlarged mediastinal, hilar, or axillary lymph nodes. Thyroid gland, trachea, and esophagus demonstrate no significant findings. Lungs/Pleura: No pneumothorax is noted. Small bilateral pleural effusions are noted. Mild bibasilar subsegmental atelectasis is noted. Mild emphysematous disease is noted in both upper lobes. There is new large right upper lobe opacity concerning for cavitating pneumonia measuring 5.2 x 3.1 cm. Upper Abdomen: No acute abnormality. Musculoskeletal: No chest wall abnormality. No acute or significant osseous findings. Review of the MIP images confirms the above findings. IMPRESSION: 1. No definite evidence of pulmonary embolus. 2. Small bilateral pleural effusions are noted with mild bibasilar subsegmental atelectasis. 3. Large right upper lobe opacity is noted concerning for cavitating pneumonia. Follow-up CT scan in 2-3 weeks after treatment is recommended to ensure resolution and rule out underlying malignancy. 4. 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 Ascending thoracic aortic aneurysm. Recommend semi-annual imaging followup by CTA or MRA and referral to cardiothoracic surgery if not already obtained. This recommendation follows 2010  ACCF/AHA/AATS/ACR/ASA/SCA/SCAI/SIR/STS/SVM Guidelines for the Diagnosis and Management of Patients With Thoracic Aortic Disease. Circulation. 2010; 121: F026-V785. Aortic aneurysm NOS (ICD10-I71.9). 5. Aortic root is aneurysmal measuring 5.1 cm in diameter. Aortic Atherosclerosis (ICD10-I70.0) and Emphysema (ICD10-J43.9). Electronically Signed   By: Lupita Raider M.D.   On: 05/15/2019 11:08   DG Chest Portable 1 View  Result Date: 05/15/2019 CLINICAL DATA:  Cough shortness of breath bilateral leg swelling EXAM: PORTABLE CHEST 1 VIEW COMPARISON:  07/22/2018 FINDINGS: Cardiomediastinal contours are stable. Hilar structures are unremarkable. Increased opacity in the left lung base and at the right lung base compared to the previous study. Crescentic area of increased density over the right lung apex. This is new from 07/22/2018 Subacute rib fractures along the right lateral chest. Also with evidence of prior rib fractures on the left similar to CT of 12/07/2018. Visualized skeletal structures are otherwise unremarkable. IMPRESSION: 1. Increased opacity in the left lung base and at the right lung base compared to the previous study which may represent developing pneumonia. 2. Crescentic area of increased density over the right lung apex which is new from 07/22/2018. Findings are of uncertain significance potentially related to pleural thickening or loculated pleural fluid. CT of the chest may be helpful for further assessment. 3. Bilateral rib fractures similar to prior CT of 12/07/2018 4. Emphysema as before. Electronically Signed   By: Donzetta Kohut M.D.   On: 05/15/2019 08:21   VAS [place] LOWER EXTREMITY VENOUS (DVT) (ONLY [facility] & [facility])  Result Date: 05/15/2019  Lower Venous DVTStudy Indications: Edema, and Swelling.  Comparison Study: No priors. Performing Technologist: Marilynne Halsted RDMS, RVT  Examination Guidelines: A complete evaluation includes B-mode imaging, spectral Doppler, color Doppler, and power Doppler  as needed of all accessible portions of each vessel. Bilateral testing is considered an integral part of a complete examination. Limited examinations for reoccurring indications may be performed as noted. The reflux portion of the exam is performed with the patient in reverse Trendelenburg.  +---------+---------------+---------+-----------+----------+--------------+ RIGHT    CompressibilityPhasicitySpontaneityPropertiesThrombus Aging +---------+---------------+---------+-----------+----------+--------------+ CFV      Full           Yes      Yes                                 +---------+---------------+---------+-----------+----------+--------------+ SFJ      Full                                                        +---------+---------------+---------+-----------+----------+--------------+ FV Prox  Full                                                        +---------+---------------+---------+-----------+----------+--------------+ FV Mid   Full                                                        +---------+---------------+---------+-----------+----------+--------------+ FV DistalFull                                                        +---------+---------------+---------+-----------+----------+--------------+ PFV      Full                                                        +---------+---------------+---------+-----------+----------+--------------+  POP      Full           Yes      Yes                                 +---------+---------------+---------+-----------+----------+--------------+ PTV      Full                                                        +---------+---------------+---------+-----------+----------+--------------+ PERO     Full                                                        +---------+---------------+---------+-----------+----------+--------------+    +---------+---------------+---------+-----------+----------+--------------+ LEFT     CompressibilityPhasicitySpontaneityPropertiesThrombus Aging +---------+---------------+---------+-----------+----------+--------------+ CFV      Full           Yes      Yes                                 +---------+---------------+---------+-----------+----------+--------------+ SFJ      Full                                                        +---------+---------------+---------+-----------+----------+--------------+ FV Prox  Full                                                        +---------+---------------+---------+-----------+----------+--------------+ FV Mid   Full                                                        +---------+---------------+---------+-----------+----------+--------------+ FV DistalFull                                                        +---------+---------------+---------+-----------+----------+--------------+ PFV      Full                                                        +---------+---------------+---------+-----------+----------+--------------+ POP      Full           Yes      Yes                                 +---------+---------------+---------+-----------+----------+--------------+  PTV      Full                                                        +---------+---------------+---------+-----------+----------+--------------+ PERO     Full                                                        +---------+---------------+---------+-----------+----------+--------------+     Summary: RIGHT: - There is no evidence of deep vein thrombosis in the lower extremity.  - No cystic structure found in the popliteal fossa.  LEFT: - There is no evidence of deep vein thrombosis in the lower extremity.  - No cystic structure found in the popliteal fossa.  *See table(s) above for measurements and observations.    Preliminary      Procedures Procedures (including critical care time)  CRITICAL CARE Performed by: Canary Brim Yona Stansbury Total critical care time: 35 minutes Critical care time was exclusive of separately billable procedures and treating other patients. Critical care was necessary to treat or prevent imminent or life-threatening deterioration. Critical care was time spent personally by me on the following activities: development of treatment plan with patient and/or surrogate as well as nursing, discussions with consultants, evaluation of patient's response to treatment, examination of patient, obtaining history from patient or surrogate, ordering and performing treatments and interventions, ordering and review of laboratory studies, ordering and review of radiographic studies, pulse oximetry and re-evaluation of patient's condition.  Medications Ordered in ED Medications  nitroGLYCERIN (NITROSTAT) SL tablet 0.4 mg (0.4 mg Sublingual Given 05/15/19 1210)  furosemide (LASIX) injection 40 mg (has no administration in time range)  ceFEPIme (MAXIPIME) 1 g in sodium chloride 0.9 % 100 mL IVPB (has no administration in time range)  iohexol (OMNIPAQUE) 350 MG/ML injection 75 mL (75 mLs Intravenous Contrast Given 05/15/19 1039)  labetalol (NORMODYNE) injection 10 mg (10 mg Intravenous Given 05/15/19 1210)    ED Course  I have reviewed the triage vital signs and the nursing notes.  Pertinent labs & imaging results that were available during my care of the patient were reviewed by me and considered in my medical decision making (see chart for details).    MDM Rules/Calculators/A&P                      Allen Saunders is a 59 y.o. male with a past medical history significant for known thoracic aortic aneurysm with prior iliac dissection, polysubstance abuse in the past, hypertension, panic attacks, prior fall with right-sided rib fractures and pneumonia, and anxiety who presents with shortness of breath, peripheral  edema, fatigue, malaise, productive cough, and chills.  Patient was found by EMS to have tachycardia, tachypnea, and labile oxygen saturations into the 80s at times.  Patient reports he does have a productive cough with some red and pink sputum.  He reports that he had a pneumonia several weeks ago and is continued to have symptoms.  He reports he is not having fevers however at home.  He reports he is not having specific pain including no some new pain in his chest or abdomen.  He reports his legs been  more swollen the last few weeks and he feels he cannot lie flat due to the shortness of breath.  He denies any history of heart failure or DVT/PE.  He denies any nausea, vomiting, urinary symptoms or GI symptoms.  On exam, lungs have rales extensively.  Mild rhonchi as well.  Chest and abdomen are nontender.  Patient has palpable pulses in both lower extremities which have 2+ pitting edema in the entire leg.  Patient is tachycardic on my evaluation and is on oxygen supplementation to maintain oxygen saturations.  No significant wheezing appreciated.  Patient is anxious but otherwise resting.  Clinically most concerned about fluid overload and the development of new CHF.  Given his report of pneumonia several weeks ago and his continued cough, we will get chest x-ray and labs.  I have a lower suspicion for sepsis at this time and we decided to hold on the automatic code sepsis given his lack of fever on arrival.  We will however get the cultures, labs, and I will hold on automatic fluids due to the patient's clinical concern for fluid overload and likely new heart failure contributing to the shortness of breath and transient hypoxia.  Given his lack of any abdominal pain or chest pain at this time, low suspicion for a change in his aneurysm or dissection currently.  We will get ultrasound to look for DVT as well as getting a D-dimer.  Due to the new fluid overload and hypoxia, anticipate patient will require  admission once we figure out the etiology of symptoms.  9:41 AM Work-up began to return showing concern for fluid overload as we suspected.  BNP is over 2000.  D-dimer was elevated so we will get a PE study as his kidney function is normal.  Ultrasound was verbally confirmed to [place] by the tech that there was no DVT.  Patient does not have a white count.  Patient does have elevated lactic acid of 4.6 however he is still afebrile and I suspect this is more due to CHF exacerbation than sepsis at this time.  I am concerned that if we give him fluids for a code sepsis, we will send him into further respiratory failure requiring intubation.  We will hold on fluids while we get a PE study to help clarify if this is just fluid versus PE versus pneumonia.  12:09 PM CT scan returned showing no pulmonary embolism but does show pleural effusions and pulmonary edema which we expected based on his exam and elevated BNP but we also see a cavitating pneumonia.  Unfortunately, we cannot see the record from the [place] to see what antibiotics he was on during her recent admission however, we will treat him with broad-spectrum antibiotics per our pharmacy recommendations with Vanco and cefepime.  Patient was reassessed and his blood pressure has increased now into the 180s and he is tachycardic into the 140s.  We will give him labetalol as well as some nitro sublingual tablet to try to help with the blood pressure.  At this point, do not feel he needs a nitro drip but we may need this if symptoms worsen.  We will also order some Lasix for him.  Patient will be called for admission for new fluid overload with likely new heart failure as well as pneumonia leading to hypoxia and increased work of breathing and tachycardia.    Final Clinical Impression(s) / ED Diagnoses Final diagnoses:  Shortness of breath  Hypervolemia, unspecified hypervolemia type  Peripheral edema  Pneumonia due to infectious organism, unspecified  laterality, unspecified part of lung    Clinical Impression: 1. Shortness of breath   2. Hypervolemia, unspecified hypervolemia type   3. Peripheral edema   4. Pneumonia due to infectious organism, unspecified laterality, unspecified part of lung     Disposition: Admit  This note was prepared with assistance of Dragon voice recognition software. Occasional wrong-word or sound-a-like substitutions may have occurred due to the inherent limitations of voice recognition software.       Evianna Chandran, Canary Brim, MD 05/15/19 [phone]

## 2019-05-15 NOTE — Procedures (Signed)
Heart rate is too high for accurate complete echo at this time.

## 2019-05-15 NOTE — H&P (Signed)
History and Physical    Allen Saunders URK:[id number] DOB: [birth year] DOA: 05/15/2019  PCP: [name], MD  Patient coming from: Home I have personally briefly reviewed patient's old medical records in [facility]  Chief Complaint: Shortness of breath, leg swelling and productive cough since 1 week  HPI: Allen Saunders is a 59 y.o. male with medical history significant of hypertension, thoracic aortic aneurysm, tobacco abuse, alcohol abuse presents to emergency department due to shortness of breath, leg swelling and a productive cough since 1 week.  Reports that his shortness of breath is progressive, associated with bilateral leg swelling, orthopnea, PND, productive cough with pinkish-red phlegm, chills, intermittent chest pain, generalized weakness, lethargy and night sweats.  Reports watery diarrhea of 8 episodes, brown in color, no melena.  Reports unintentional weight loss from 187 to 116 pounds in 1 year.  No history of wheezing, COVID-19 exposure, abdominal pain, dysuria, hematuria, hematemesis, previous history of tuberculosis, COPD, PE or DVT.  He lives with his wife at home.  Smokes 1 pack of cigarettes per day, drinks 8-12 beers per day, no illicit drug use.  ED Course: Upon arrival to ED: Patient tachycardic, tachypneic, hypoxic requiring 2 L of oxygen via nasal cannula, afebrile with no leukocytosis, lactic acid elevated to 4.6 trended down to 2.3, troponin: 44 trended up to 55, BMP shows potassium of 3.4, lipase: WNL, UA negative for infection.  COVID-19, blood culture and urine culture: Pending.  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  Patient received Lasix 40 mg IV once, Vanco and cefepime in the ED.  [facility] hospitalist consulted for admission for pneumonia and new onset CHF.  Review of Systems: As per HPI  otherwise negative.    Past Medical History:  Diagnosis Date  . Anxiety   . Chest pain, with abnormal EKG 10/03/2011  . Hx of echocardiogram 12/02/2016  . Hypertension   . 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 Panic attack   . Substance abuse (HCC)    ETOH  . Thoracic aortic aneurysm, without rupture (HCC)    NOTED 06/09/2016 on ECHO    Past Surgical History:  Procedure Laterality Date  . COLONOSCOPY  11/13/2013   h/o diarrhea, diverticula, large hemorrhoids  . KNEE ARTHROSCOPY W/ MENISCAL REPAIR Left    X2 in the 80's  . KNEE ARTHROSCOPY W/ MENISCAL REPAIR Right    x 2 in the 80's  . LEFT HEART CATHETERIZATION WITH CORONARY ANGIOGRAM N/A 10/03/2011   Procedure: LEFT HEART CATHETERIZATION WITH CORONARY ANGIOGRAM;  Surgeon: [name], MD;  Location[facility] CATH LAB;  Service: Cardiovascular;  Laterality: N/A;     reports that he has been smoking cigarettes. He started smoking about 41 years ago. He has been smoking about 1.00 pack per day. He has never used smokeless tobacco. He reports current alcohol use. He reports that he does not use drugs.  Allergies  Allergen Reactions  . Asa [Aspirin] Other (See Comments)    Sore throat, gi upset    Family History  Problem Relation Age of Onset  . Coronary artery disease Father     Prior to Admission medications   Medication Sig Start Date End Date Taking? Authorizing Provider  Lidocaine (HM LIDOCAINE PATCH) 4 % PTCH Apply 1 patch topically daily as needed (Back pain).   Yes [provider]  triamcinolone cream (  KENALOG) 0.1 % Apply 1 application topically 2 (two) times daily. Patient not taking: Reported on 05/15/2019 07/22/18   Tanda Rockers, PA-C    Physical Exam: Vitals:   05/15/19 0804 05/15/19 0805 05/15/19 0806 05/15/19 0810  BP:  (!) 139/99  (!) 139/99  Pulse:  (!) 111  (!) 111  Resp:  (!) 26    Temp:  97.6 F (36.4 C)    TempSrc:  Oral    SpO2: 96% 99%    Weight:   52.6 kg   Height:   6' 5.5" (1.969 m)      Constitutional: In mild respiratory distress, on 2 L of oxygen via nasal cannula, cachectic. Eyes: PERRL, lids and conjunctivae normal ENMT: Mucous membranes are moist. Posterior pharynx clear of any exudate or lesions.Normal dentition.  Neck: normal, supple, no masses, no thyromegaly Respiratory: Rales noted at bilateral lung base, no wheezing, no crackles. Cardiovascular: Regular rate and rhythm, no murmurs / rubs / gallops.  Bilateral 3+ pitting edema positive. 2+ pedal pulses. No carotid bruits.  Abdomen: no tenderness, no masses palpated. No hepatosplenomegaly. Bowel sounds positive.  Musculoskeletal: no clubbing / cyanosis. No joint deformity upper and lower extremities. Good ROM, no contractures. Normal muscle tone.  Skin: no rashes, lesions, ulcers. No induration Neurologic: CN 2-12 grossly intact. Sensation intact, DTR normal. Strength 5/5 in all 4.  Psychiatric: Normal judgment and insight. Alert and oriented x 3. Normal mood.    Labs on Admission: I have personally reviewed following labs and imaging studies  CBC: Recent Labs  Lab 05/15/19 0816  WBC 7.4  NEUTROABS 5.2  HGB 10.3*  HCT 34.2*  MCV 114.0*  PLT 220   Basic Metabolic Panel: Recent Labs  Lab 05/15/19 0816  NA 139  K 3.4*  CL 105  CO2 18*  GLUCOSE 92  BUN 6  CREATININE 0.72  CALCIUM 8.9   GFR: Estimated Creatinine Clearance: 74.9 mL/min (by C-G formula based on SCr of 0.72 mg/dL). Liver Function Tests: Recent Labs  Lab 05/15/19 0816  AST 19  ALT 11  ALKPHOS 75  BILITOT 0.7  PROT 7.4  ALBUMIN 2.7*   Recent Labs  Lab 05/15/19 0816  LIPASE 22   No results for input(s): AMMONIA in the last 168 hours. Coagulation Profile: No results for input(s): INR, PROTIME in the last 168 hours. Cardiac Enzymes: No results for input(s): CKTOTAL, CKMB, CKMBINDEX, TROPONINI in the last 168 hours. BNP (last 3 results) No results for input(s): PROBNP in the last 8760 hours. HbA1C: No results for  input(s): HGBA1C in the last 72 hours. CBG: No results for input(s): GLUCAP in the last 168 hours. Lipid Profile: No results for input(s): CHOL, HDL, LDLCALC, TRIG, CHOLHDL, LDLDIRECT in the last 72 hours. Thyroid Function Tests: No results for input(s): TSH, T4TOTAL, FREET4, T3FREE, THYROIDAB in the last 72 hours. Anemia Panel: No results for input(s): VITAMINB12, FOLATE, FERRITIN, TIBC, IRON, RETICCTPCT in the last 72 hours. Urine analysis:    Component Value Date/Time   COLORURINE STRAW (A) 05/15/2019 0856   APPEARANCEUR CLEAR 05/15/2019 0856   LABSPEC 1.004 (L) 05/15/2019 0856   PHURINE 5.0 05/15/2019 0856   GLUCOSEU NEGATIVE 05/15/2019 0856   HGBUR NEGATIVE 05/15/2019 0856   BILIRUBINUR NEGATIVE 05/15/2019 0856   KETONESUR NEGATIVE 05/15/2019 0856   PROTEINUR NEGATIVE 05/15/2019 0856   NITRITE NEGATIVE 05/15/2019 0856   LEUKOCYTESUR NEGATIVE 05/15/2019 0856    Radiological Exams on Admission: CT Angio Chest PE W and/or Wo Contrast  Result Date: 05/15/2019  CLINICAL DATA:  Shortness of breath. EXAM: CT ANGIOGRAPHY CHEST WITH CONTRAST TECHNIQUE: Multidetector CT imaging of the chest was performed using the standard protocol during bolus administration of intravenous contrast. Multiplanar CT image reconstructions and MIPs were obtained to evaluate the vascular anatomy. CONTRAST:  75mL OMNIPAQUE IOHEXOL 350 MG/ML SOLN COMPARISON:  December 07, 2018. FINDINGS: Cardiovascular: Satisfactory opacification of the pulmonary arteries to the segmental level. No evidence of pulmonary embolism. Normal heart size. No pericardial effusion. Atherosclerosis of thoracic aorta is noted. 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 Mediastinum/Nodes: No enlarged mediastinal, hilar, or axillary lymph nodes. Thyroid gland, trachea, and esophagus demonstrate no significant findings. Lungs/Pleura: No pneumothorax is noted. Small bilateral pleural effusions  are noted. Mild bibasilar subsegmental atelectasis is noted. Mild emphysematous disease is noted in both upper lobes. There is new large right upper lobe opacity concerning for cavitating pneumonia measuring 5.2 x 3.1 cm. Upper Abdomen: No acute abnormality. Musculoskeletal: No chest wall abnormality. No acute or significant osseous findings. Review of the MIP images confirms the above findings. IMPRESSION: 1. No definite evidence of pulmonary embolus. 2. Small bilateral pleural effusions are noted with mild bibasilar subsegmental atelectasis. 3. Large right upper lobe opacity is noted concerning for cavitating pneumonia. Follow-up CT scan in 2-3 weeks after treatment is recommended to ensure resolution and rule out underlying malignancy. 4. 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 Ascending thoracic aortic aneurysm. Recommend semi-annual imaging followup by CTA or MRA and referral to cardiothoracic surgery if not already obtained. This recommendation follows 2010 ACCF/AHA/AATS/ACR/ASA/SCA/SCAI/SIR/STS/SVM Guidelines for the Diagnosis and Management of Patients With Thoracic Aortic Disease. Circulation. 2010; 121: Z610-R604. Aortic aneurysm NOS (ICD10-I71.9). 5. Aortic root is aneurysmal measuring 5.1 cm in diameter. Aortic Atherosclerosis (ICD10-I70.0) and Emphysema (ICD10-J43.9). Electronically Signed   By: Lupita Raider M.D.   On: 05/15/2019 11:08   DG Chest Portable 1 View  Result Date: 05/15/2019 CLINICAL DATA:  Cough shortness of breath bilateral leg swelling EXAM: PORTABLE CHEST 1 VIEW COMPARISON:  07/22/2018 FINDINGS: Cardiomediastinal contours are stable. Hilar structures are unremarkable. Increased opacity in the left lung base and at the right lung base compared to the previous study. Crescentic area of increased density over the right lung apex. This is new from 07/22/2018 Subacute rib fractures along the right lateral chest. Also with evidence of prior rib fractures on the left similar  to CT of 12/07/2018. Visualized skeletal structures are otherwise unremarkable. IMPRESSION: 1. Increased opacity in the left lung base and at the right lung base compared to the previous study which may represent developing pneumonia. 2. Crescentic area of increased density over the right lung apex which is new from 07/22/2018. Findings are of uncertain significance potentially related to pleural thickening or loculated pleural fluid. CT of the chest may be helpful for further assessment. 3. Bilateral rib fractures similar to prior CT of 12/07/2018 4. Emphysema as before. Electronically Signed   By: Donzetta Kohut M.D.   On: 05/15/2019 08:21   VAS [place] LOWER EXTREMITY VENOUS (DVT) (ONLY [facility] & [facility])  Result Date: 05/15/2019  Lower Venous DVTStudy Indications: Edema, and Swelling.  Comparison Study: No priors. Performing Technologist: Marilynne Halsted RDMS, RVT  Examination Guidelines: A complete evaluation includes B-mode imaging, spectral Doppler, color Doppler, and power Doppler as needed of all accessible portions of each vessel. Bilateral testing is considered an integral part of a complete examination. Limited examinations for reoccurring indications may be performed as noted.  The reflux portion of the exam is performed with the patient in reverse Trendelenburg.  +---------+---------------+---------+-----------+----------+--------------+ RIGHT    CompressibilityPhasicitySpontaneityPropertiesThrombus Aging +---------+---------------+---------+-----------+----------+--------------+ CFV      Full           Yes      Yes                                 +---------+---------------+---------+-----------+----------+--------------+ SFJ      Full                                                        +---------+---------------+---------+-----------+----------+--------------+ FV Prox  Full                                                         +---------+---------------+---------+-----------+----------+--------------+ FV Mid   Full                                                        +---------+---------------+---------+-----------+----------+--------------+ FV DistalFull                                                        +---------+---------------+---------+-----------+----------+--------------+ PFV      Full                                                        +---------+---------------+---------+-----------+----------+--------------+ POP      Full           Yes      Yes                                 +---------+---------------+---------+-----------+----------+--------------+ PTV      Full                                                        +---------+---------------+---------+-----------+----------+--------------+ PERO     Full                                                        +---------+---------------+---------+-----------+----------+--------------+   +---------+---------------+---------+-----------+----------+--------------+ LEFT     CompressibilityPhasicitySpontaneityPropertiesThrombus Aging +---------+---------------+---------+-----------+----------+--------------+ CFV      Full           Yes  Yes                                 +---------+---------------+---------+-----------+----------+--------------+ SFJ      Full                                                        +---------+---------------+---------+-----------+----------+--------------+ FV Prox  Full                                                        +---------+---------------+---------+-----------+----------+--------------+ FV Mid   Full                                                        +---------+---------------+---------+-----------+----------+--------------+ FV DistalFull                                                         +---------+---------------+---------+-----------+----------+--------------+ PFV      Full                                                        +---------+---------------+---------+-----------+----------+--------------+ POP      Full           Yes      Yes                                 +---------+---------------+---------+-----------+----------+--------------+ PTV      Full                                                        +---------+---------------+---------+-----------+----------+--------------+ PERO     Full                                                        +---------+---------------+---------+-----------+----------+--------------+     Summary: RIGHT: - There is no evidence of deep vein thrombosis in the lower extremity.  - No cystic structure found in the popliteal fossa.  LEFT: - There is no evidence of deep vein thrombosis in the lower extremity.  - No cystic structure found in the popliteal fossa.  *See table(s) above for measurements and observations.    Preliminary     EKG: Independently reviewed.  Sinus tachycardia,, bpm: 107,  probable left atrial enlargement, borderline T wave abnormalities in inferior leads.  No ST elevation.  Assessment/Plan Principal Problem:   Acute hypoxemic respiratory failure (HCC) Active Problems:   Hypertension   Thoracic aortic aneurysm, without rupture (HCC)   Hypokalemia   Elevated troponin   Lactic acidosis   Macrocytic anemia   Fluid overload   Cavitary pneumonia   Tobacco abuse   Alcohol abuse    Acute hypoxemic respiratory failure Sepsis -In the setting of right upper lobe cavitating pneumonia and fluid overload -Patient presented with worsening shortness of breath, leg swelling and productive cough. -He is tachycardic, tachypneic, lactic acid elevated at 4.6--trended down to 2.3.  He is afebrile with no leukocytosis. -Lipase, UA: Negative.  BNP: 2123 -D-dimer: 1.49, CT angiogram is negative for  PE -Received Lasix, IV Vanco and cefepime in the ED -Admit patient to stepdown unit for close monitoring. -Start on Lasix 40 mg IV twice daily, will get transthoracic echo -Strict INO's and daily weight.  Check electrolytes.  Check TSH -Continue IV Vanco and cefepime -Check procalcitonin, blood culture, urine strep antigen, urine Legionella antigen, sputum culture -DuoNebs as needed -Consulted cardiology-for new onset CHF  Bilateral leg swelling: Likely in the setting of new onset CHF -Doppler ultrasound of bilateral lower extremity came back negative for DVT. -Continue Lasix.  Strict INO's and daily weight  Macrocytic anemia likely in the setting of alcohol abuse -Check folate and B12 level.  Monitor H&H closely -Transfuse as needed  Hypokalemia: Potassium 3.4 -Replenished.  Check magnesium level -Repeat BMP tomorrow a.m.  Elevated troponin: -Likely in the setting of demand ischemia.  Patient denies ACS symptoms -Reviewed EKG.  Trend troponin  Hypertension: -We will continue home meds of amlodipine.  Labetalol as needed -Monitor blood pressure closely  Tobacco abuse: Patient refused nicotine patch -Counseled about cessation  Alcohol abuse: Counseled about cessation -Check ethanol level, started on CIWA protocol -On seizure precautions  Malnutrition: -Albumin: 2.7.  -Consulted dietitian  Weight loss: -Could be secondary to underlying lung malignancy?  Patient is current smoker -Patient lost significant amount of weight in 1 year (about 52 lbs)-unintentionally  DVT prophylaxis: Lovenox/SCD/TED  code Status: Full code-confirmed with the patient Family Communication: Patient's wife present at bedside.  Plan of care discussed with patient and his wife at bedside in length and they verbalized understanding and agreed with it. Disposition Plan: To be determined  consults called: Cardiology Admission status: Inpatient   Mckinley Jewel MD [facility] Pager [phone]  If 7PM-7AM, please contact night-coverage [email or web address] Password [facility]  05/15/2019, 12:49 PM

## 2019-05-15 NOTE — ED Triage Notes (Signed)
Dr Rush Landmark to place orders if he feels pt is septic.

## 2019-05-16 ENCOUNTER — Other Ambulatory Visit (HOSPITAL_COMMUNITY): Payer: 59

## 2019-05-16 ENCOUNTER — Inpatient Hospital Stay (HOSPITAL_COMMUNITY): Payer: 59

## 2019-05-16 DIAGNOSIS — F1010 Alcohol abuse, uncomplicated: Secondary | ICD-10-CM

## 2019-05-16 DIAGNOSIS — I10 Essential (primary) hypertension: Secondary | ICD-10-CM

## 2019-05-16 DIAGNOSIS — R609 Edema, unspecified: Secondary | ICD-10-CM

## 2019-05-16 DIAGNOSIS — R000 Tachycardia, unspecified: Secondary | ICD-10-CM

## 2019-05-16 DIAGNOSIS — E8770 Fluid overload, unspecified: Secondary | ICD-10-CM

## 2019-05-16 DIAGNOSIS — R600 Localized edema: Secondary | ICD-10-CM

## 2019-05-16 DIAGNOSIS — E43 Unspecified severe protein-calorie malnutrition: Secondary | ICD-10-CM | POA: Insufficient documentation

## 2019-05-16 DIAGNOSIS — I340 Nonrheumatic mitral (valve) insufficiency: Secondary | ICD-10-CM

## 2019-05-16 DIAGNOSIS — I351 Nonrheumatic aortic (valve) insufficiency: Secondary | ICD-10-CM

## 2019-05-16 LAB — CBC
HCT: 30.6 % — ABNORMAL LOW (ref 39.0–52.0)
Hemoglobin: 9.6 g/dL — ABNORMAL LOW (ref 13.0–17.0)
MCH: 34.4 pg — ABNORMAL HIGH (ref 26.0–34.0)
MCHC: 31.4 g/dL (ref 30.0–36.0)
MCV: 109.7 fL — ABNORMAL HIGH (ref 80.0–100.0)
Platelets: 224 10*3/uL (ref 150–400)
RBC: 2.79 MIL/uL — ABNORMAL LOW (ref 4.22–5.81)
RDW: 19.4 % — ABNORMAL HIGH (ref 11.5–15.5)
WBC: 6.3 10*3/uL (ref 4.0–10.5)
nRBC: 0.3 % — ABNORMAL HIGH (ref 0.0–0.2)

## 2019-05-16 LAB — COMPREHENSIVE METABOLIC PANEL
ALT: 10 U/L (ref 0–44)
AST: 19 U/L (ref 15–41)
Albumin: 2.4 g/dL — ABNORMAL LOW (ref 3.5–5.0)
Alkaline Phosphatase: 58 U/L (ref 38–126)
Anion gap: 10 (ref 5–15)
BUN: 6 mg/dL (ref 6–20)
CO2: 25 mmol/L (ref 22–32)
Calcium: 9.1 mg/dL (ref 8.9–10.3)
Chloride: 107 mmol/L (ref 98–111)
Creatinine, Ser: 0.98 mg/dL (ref 0.61–1.24)
GFR calc Af Amer: >60 mL/min (ref >60)
GFR calc non Af Amer: >60 mL/min (ref >60)
Glucose, Bld: 84 mg/dL (ref 70–99)
Potassium: 3.4 mmol/L — ABNORMAL LOW (ref 3.5–5.1)
Sodium: 142 mmol/L (ref 135–145)
Total Bilirubin: 1.4 mg/dL — ABNORMAL HIGH (ref 0.3–1.2)
Total Protein: 6.6 g/dL (ref 6.5–8.1)

## 2019-05-16 LAB — URINE CULTURE: Culture: NO GROWTH

## 2019-05-16 LAB — ECHOCARDIOGRAM COMPLETE
Height: 77 in
Weight: 1904 oz

## 2019-05-16 LAB — FOLATE RBC
Folate, Hemolysate: 340 ng/mL
Folate, RBC: 1104 ng/mL (ref >498)
Hematocrit: 30.8 % — ABNORMAL LOW (ref 37.5–51.0)

## 2019-05-16 LAB — PROCALCITONIN: Procalcitonin: <0.1 ng/mL

## 2019-05-16 LAB — MRSA PCR SCREENING: MRSA by PCR: NEGATIVE

## 2019-05-16 MED ORDER — VITAMIN B-12 1000 MCG PO TABS
1000.0000 ug | ORAL_TABLET | Freq: Every day | ORAL | Status: DC
  Administered 2019-05-17: 1000 ug via ORAL
  Filled 2019-05-16: qty 1

## 2019-05-16 MED ORDER — POTASSIUM CHLORIDE CRYS ER 20 MEQ PO TBCR
40.0000 meq | EXTENDED_RELEASE_TABLET | Freq: Once | ORAL | Status: AC
  Administered 2019-05-16: 40 meq via ORAL
  Filled 2019-05-16: qty 2

## 2019-05-16 MED ORDER — PIPERACILLIN-TAZOBACTAM 3.375 G IVPB
3.3750 g | Freq: Three times a day (TID) | INTRAVENOUS | Status: DC
  Administered 2019-05-16: 3.375 g via INTRAVENOUS
  Filled 2019-05-16 (×3): qty 50

## 2019-05-16 MED ORDER — MAGNESIUM SULFATE 2 GM/50ML IV SOLN
2.0000 g | Freq: Once | INTRAVENOUS | Status: AC
  Administered 2019-05-16: 2 g via INTRAVENOUS
  Filled 2019-05-16: qty 50

## 2019-05-16 MED ORDER — CYANOCOBALAMIN 1000 MCG/ML IJ SOLN
1000.0000 ug | Freq: Once | INTRAMUSCULAR | Status: AC
  Administered 2019-05-16: 1000 ug via INTRAMUSCULAR
  Filled 2019-05-16: qty 1

## 2019-05-16 MED ORDER — PIPERACILLIN-TAZOBACTAM 3.375 G IVPB
3.3750 g | Freq: Three times a day (TID) | INTRAVENOUS | Status: DC
  Administered 2019-05-16 – 2019-05-18 (×5): 3.375 g via INTRAVENOUS
  Filled 2019-05-16 (×6): qty 50

## 2019-05-16 MED ORDER — ENSURE ENLIVE PO LIQD
237.0000 mL | Freq: Three times a day (TID) | ORAL | Status: DC
  Administered 2019-05-16 – 2019-05-20 (×8): 237 mL via ORAL

## 2019-05-16 MED ORDER — HYDROCORTISONE 0.5 % EX CREA
TOPICAL_CREAM | Freq: Two times a day (BID) | CUTANEOUS | Status: DC
  Filled 2019-05-16: qty 28.35

## 2019-05-16 MED ORDER — METOPROLOL TARTRATE 12.5 MG HALF TABLET
12.5000 mg | ORAL_TABLET | Freq: Two times a day (BID) | ORAL | Status: DC
  Administered 2019-05-16 (×2): 12.5 mg via ORAL
  Filled 2019-05-16 (×2): qty 1

## 2019-05-16 MED ORDER — HYDROCORTISONE 1 % EX CREA
TOPICAL_CREAM | Freq: Two times a day (BID) | CUTANEOUS | Status: DC
  Filled 2019-05-16: qty 28

## 2019-05-16 NOTE — Progress Notes (Signed)
  Echocardiogram 2D Echocardiogram has been performed.  Allen Saunders 05/16/2019, 4:20 PM

## 2019-05-16 NOTE — Progress Notes (Signed)
Progress Note  Patient Name: Allen Saunders Date of Encounter: 05/16/2019  Primary Cardiologist: New to [facility] [place]  Subjective   Doing better today. Denies CP. No SOB. Good UO  Inpatient Medications    Scheduled Meds:  cyanocobalamin  1,000 mcg Intramuscular Once   enoxaparin (LOVENOX) injection  40 mg Subcutaneous Q24H   folic acid  1 mg Oral Daily   furosemide  40 mg Intravenous Q12H   losartan  50 mg Oral Daily   multivitamin with minerals  1 tablet Oral Daily   potassium chloride  40 mEq Oral Once   thiamine  100 mg Oral Daily   [START ON 05/17/2019] vitamin B-12  1,000 mcg Oral Daily   Continuous Infusions:  magnesium sulfate bolus IVPB     piperacillin-tazobactam (ZOSYN)  IV     vancomycin     PRN Meds: acetaminophen **OR** acetaminophen, ipratropium-albuterol, labetalol, nitroGLYCERIN, ondansetron **OR** ondansetron (ZOFRAN) IV   Vital Signs    Vitals:   05/15/19 1938 05/16/19 0011 05/16/19 0304 05/16/19 0800  BP: (!) 123/93 (!) 126/99 113/76 120/75  Pulse: (!) 127 (!) 108 (!) 106 (!) 109  Resp: 19 18 19 18   Temp: (!) 97.5 F (36.4 C) 98.3 F (36.8 C) 98.9 F (37.2 C) 98.6 F (37 C)  TempSrc: Oral Oral Oral Oral  SpO2: 100% 98% 100% 100%  Weight:   54 kg   Height:        Intake/Output Summary (Last 24 hours) at 05/16/2019 0906 Last data filed at 05/16/2019 0311 Gross per 24 hour  Intake 622.2 ml  Output 4600 ml  Net -3977.8 ml   Filed Weights   05/15/19 0806 05/15/19 1758 05/16/19 0304  Weight: 52.6 kg 55.3 kg 54 kg    Physical Exam   General: Cachetic, frail, NAD Neck: Negative for carotid bruits. No JVD Lungs: Clear, diminished in bases L>R. No wheezes, rales, or rhonchi. Breathing is unlabored. Cardiovascular: RRR with S1 S2. No murmurs Abdomen: Soft, non-tender, non-distended. No obvious abdominal masses. Extremities: 1-2+ BLE edema. Radial pulses 2+ bilaterally Neuro: Alert and oriented. No focal deficits. No facial asymmetry. MAE  spontaneously. Psych: Responds to questions appropriately with normal affect.    Labs    Chemistry Recent Labs  Lab 05/15/19 0816 05/15/19 1413 05/16/19 0438  NA 139  --  142  K 3.4*  --  3.4*  CL 105  --  107  CO2 18*  --  25  GLUCOSE 92  --  84  BUN 6  --  6  CREATININE 0.72 0.76 0.98  CALCIUM 8.9  --  9.1  PROT 7.4  --  6.6  ALBUMIN 2.7*  --  2.4*  AST 19  --  19  ALT 11  --  10  ALKPHOS 75  --  58  BILITOT 0.7  --  1.4*  GFRNONAA >60 >60 >60  GFRAA >60 >60 >60  ANIONGAP 16*  --  10     Hematology Recent Labs  Lab 05/15/19 0816 05/15/19 1413 05/16/19 0438  WBC 7.4 7.4 6.3  RBC 3.00* 3.12* 2.79*  HGB 10.3* 10.7* 9.6*  HCT 34.2* 35.3* 30.6*  MCV 114.0* 113.1* 109.7*  MCH 34.3* 34.3* 34.4*  MCHC 30.1 30.3 31.4  RDW 20.0* 19.9* 19.4*  PLT 220 228 224    Cardiac EnzymesNo results for input(s): TROPONINI in the last 168 hours. No results for input(s): TROPIPOC in the last 168 hours.   BNP Recent Labs  Lab 05/15/19 [phone]  BNP 2,123.1*     DDimer  Recent Labs  Lab 05/15/19 0816  DDIMER 1.49*     Radiology    CT Angio Chest PE W and/or Wo Contrast  Result Date: 05/15/2019 CLINICAL DATA:  Shortness of breath. EXAM: CT ANGIOGRAPHY CHEST WITH CONTRAST TECHNIQUE: Multidetector CT imaging of the chest was performed using the standard protocol during bolus administration of intravenous contrast. Multiplanar CT image reconstructions and MIPs were obtained to evaluate the vascular anatomy. CONTRAST:  63mL OMNIPAQUE IOHEXOL 350 MG/ML SOLN COMPARISON:  December 07, 2018. FINDINGS: Cardiovascular: Satisfactory opacification of the pulmonary arteries to the segmental level. No evidence of pulmonary embolism. Normal heart size. No pericardial effusion. Atherosclerosis of thoracic aorta is noted. 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 Mediastinum/Nodes: No enlarged mediastinal, hilar, or axillary lymph nodes.  Thyroid gland, trachea, and esophagus demonstrate no significant findings. Lungs/Pleura: No pneumothorax is noted. Small bilateral pleural effusions are noted. Mild bibasilar subsegmental atelectasis is noted. Mild emphysematous disease is noted in both upper lobes. There is new large right upper lobe opacity concerning for cavitating pneumonia measuring 5.2 x 3.1 cm. Upper Abdomen: No acute abnormality. Musculoskeletal: No chest wall abnormality. No acute or significant osseous findings. Review of the MIP images confirms the above findings. IMPRESSION: 1. No definite evidence of pulmonary embolus. 2. Small bilateral pleural effusions are noted with mild bibasilar subsegmental atelectasis. 3. Large right upper lobe opacity is noted concerning for cavitating pneumonia. Follow-up CT scan in 2-3 weeks after treatment is recommended to ensure resolution and rule out underlying malignancy. 4. 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 Ascending thoracic aortic aneurysm. Recommend semi-annual imaging followup by CTA or MRA and referral to cardiothoracic surgery if not already obtained. This recommendation follows 2010 ACCF/AHA/AATS/ACR/ASA/SCA/SCAI/SIR/STS/SVM Guidelines for the Diagnosis and Management of Patients With Thoracic Aortic Disease. Circulation. 2010; 121: R154-M086. Aortic aneurysm NOS (ICD10-I71.9). 5. Aortic root is aneurysmal measuring 5.1 cm in diameter. Aortic Atherosclerosis (ICD10-I70.0) and Emphysema (ICD10-J43.9). Electronically Signed   By: Marijo Conception M.D.   On: 05/15/2019 11:08   DG Chest Portable 1 View  Result Date: 05/15/2019 CLINICAL DATA:  Cough shortness of breath bilateral leg swelling EXAM: PORTABLE CHEST 1 VIEW COMPARISON:  07/22/2018 FINDINGS: Cardiomediastinal contours are stable. Hilar structures are unremarkable. Increased opacity in the left lung base and at the right lung base compared to the previous study. Crescentic area of increased density over the right lung  apex. This is new from 07/22/2018 Subacute rib fractures along the right lateral chest. Also with evidence of prior rib fractures on the left similar to CT of 12/07/2018. Visualized skeletal structures are otherwise unremarkable. IMPRESSION: 1. Increased opacity in the left lung base and at the right lung base compared to the previous study which may represent developing pneumonia. 2. Crescentic area of increased density over the right lung apex which is new from 07/22/2018. Findings are of uncertain significance potentially related to pleural thickening or loculated pleural fluid. CT of the chest may be helpful for further assessment. 3. Bilateral rib fractures similar to prior CT of 12/07/2018 4. Emphysema as before. Electronically Signed   By: Zetta Bills M.D.   On: 05/15/2019 08:21   VAS [place] LOWER EXTREMITY VENOUS (DVT) (ONLY [facility] & [facility])  Result Date: 05/15/2019  Lower Venous DVTStudy Indications: Edema, and Swelling.  Comparison Study: No priors. Performing Technologist: Oda Cogan RDMS, RVT  Examination Guidelines: A complete evaluation includes B-mode imaging,  spectral Doppler, color Doppler, and power Doppler as needed of all accessible portions of each vessel. Bilateral testing is considered an integral part of a complete examination. Limited examinations for reoccurring indications may be performed as noted. The reflux portion of the exam is performed with the patient in reverse Trendelenburg.  +---------+---------------+---------+-----------+----------+--------------+  RIGHT     Compressibility Phasicity Spontaneity Properties Thrombus Aging  +---------+---------------+---------+-----------+----------+--------------+  CFV       Full            Yes       Yes                                    +---------+---------------+---------+-----------+----------+--------------+  SFJ       Full                                                              +---------+---------------+---------+-----------+----------+--------------+  FV Prox   Full                                                             +---------+---------------+---------+-----------+----------+--------------+  FV Mid    Full                                                             +---------+---------------+---------+-----------+----------+--------------+  FV Distal Full                                                             +---------+---------------+---------+-----------+----------+--------------+  PFV       Full                                                             +---------+---------------+---------+-----------+----------+--------------+  POP       Full            Yes       Yes                                    +---------+---------------+---------+-----------+----------+--------------+  PTV       Full                                                             +---------+---------------+---------+-----------+----------+--------------+  PERO      Full                                                             +---------+---------------+---------+-----------+----------+--------------+   +---------+---------------+---------+-----------+----------+--------------+  LEFT      Compressibility Phasicity Spontaneity Properties Thrombus Aging  +---------+---------------+---------+-----------+----------+--------------+  CFV       Full            Yes       Yes                                    +---------+---------------+---------+-----------+----------+--------------+  SFJ       Full                                                             +---------+---------------+---------+-----------+----------+--------------+  FV Prox   Full                                                             +---------+---------------+---------+-----------+----------+--------------+  FV Mid    Full                                                              +---------+---------------+---------+-----------+----------+--------------+  FV Distal Full                                                             +---------+---------------+---------+-----------+----------+--------------+  PFV       Full                                                             +---------+---------------+---------+-----------+----------+--------------+  POP       Full            Yes       Yes                                    +---------+---------------+---------+-----------+----------+--------------+  PTV       Full                                                             +---------+---------------+---------+-----------+----------+--------------+  PERO      Full                                                             +---------+---------------+---------+-----------+----------+--------------+     Summary: RIGHT: - There is no evidence of deep vein thrombosis in the lower extremity.  - No cystic structure found in the popliteal fossa.  LEFT: - There is no evidence of deep vein thrombosis in the lower extremity.  - No cystic structure found in the popliteal fossa.  *See table(s) above for measurements and observations. Electronically signed by Sherald Hess MD on 05/15/2019 at 5:31:09 PM.    Final    Telemetry    05/16/19 ST with rates in the low 100's  - Personally Reviewed  ECG    No new tracing as of 05/16/19- Personally Reviewed  Cardiac Studies   ECHO: 12/10/2016:  Scanned document: LVEF at 55 to 60% with grade 2 DD.  There was evidence of moderate aortic root dilation at 4.3 cm at that time.  CATH: 10/03/2011:  1. Left main; normal  2. LAD; normal 3. Left circumflex; normal and nondominant.  4. Right coronary artery; normal and dominant 5. Left ventriculography; RAO left ventriculogram was performed using  25 cc of Visipaque dye at 12 cc per second. There were no wall motion abnormalities.the EF was estimated visually at 60%.  IMPRESSION:Mr. Quang has  normal coronary arteries and normal left ventricular function. Believe his chest pain is chest wall from coughing and his EKG or represents early repolarization pattern. Continued medical therapy will be recommended. The sheath was removed and it a TR band is placed on the right wrist chief patent hemostasis. The patient left the Cath Lab in stable condition. Initially hydrated and discharged home in the morning.  Patient Profile     59 y.o. male  with a hx of hypertension, thoracic aortic aneurysm, aortic root aneurysm, tobacco and alcohol abuse who is being seen today for the evaluation of acute CHF at the request of Dr Jacqulyn Bath.  Assessment & Plan    1.  Acute systolic CHF: -CXR with increased opacity in the left lung and right lung base compared to previous imaging representing developing PNA -BNP on presentation >2000 with LE edema and orthopnea symptoms consistent with presumed systolic dysfunction -Echocardiogram ordered by primary admitting team with pending results>>unfortunately HR too high yesterday afternoon to proceed  With study>>attempt today  -HST found to be mildly elevated at 44 with repeat at 55 not necessarily consistent with ACS given significant fluid volume overload and presumed systolic CHF -Started on IV Lasix 40 mg twice daily>> continue>>good UO -Continue losartan 50 mg p.o. daily -No beta-blocker at this time given suspected acute systolic dysfunction with fluid volume overload -Weight, 119lb today  -I&O, net 3.9L since ED admission  2. 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 daily  3. Sinus tachycardia: -Unclear etiology -TSH  normal  -? Underlying LV dysfunction -Pending echo>>ordered 4/5    3.  Unintentional weight loss: -Pt reports unintentional weight loss over the last year>>185lb down to 116lb  -He states he has had poor oral intake secondary to difficulty swallowing however concerning given CAP PNA treated with outpatient oral antibiotics for 6 weeks without resolution.  CTA performed during this hospitalization with right upper lobe opacity concerning for cavitating pneumonia measuring 5.2 x 3.1 cm with recommendations for follow-up CT scan in 2 to 3 weeks after treatment to ensure resolution and rule out underlying lung malignancy. -Albumin, 2.7 -TSH found to be 2.063>>WNL  -Management per primary team  4. Hypertensive urgency: -Patient found to be hypertensive on admission -Improved today 120/75>113/76 -On PTA amlodipine 10mg  PO QD -Amlodipine stopped>losartan 50mg  PO QD initiated   -Monitor BP closely. May need further antihypertensive titration  5.  Tobacco and alcohol abuse: -Smoking cessation strongly encouraged -Reports 8-12 beers per day -CIWA protocol per primary team  Signed, NP-[facility] Pager: [phone] 05/16/2019, 9:06 AM     For questions or updates, please contact   Please consult [email or web address] for contact info under Cardiology/STEMI.

## 2019-05-16 NOTE — Progress Notes (Signed)
Received pt report. Pt resting in bed with blanket over head. No c/o voiced or distress noted at this time. IV abx infusing without complications.

## 2019-05-16 NOTE — Progress Notes (Signed)
PROGRESS NOTE  Lynkin Saini VOJ:[id number] DOB: 23-Dec-1960 DOA: 05/15/2019 PCP: [name], MD   LOS: 1 day   Brief Narrative / Interim history: 59 year old male with hypertension, thoracic aortic aneurysm, tobacco and alcohol abuse, came in with shortness of breath, leg swelling and productive cough for the past week.  Reports that shortness of breath was progressive, associated with bilateral leg swelling, orthopnea, and has had a cough productive of pinkish-red flag him.  He also reports chills and intermittent chest pain, generalized weakness, lethargy and night sweats.  He was evaluated by his PCP at the [place], and also he was referred for oncology at the [facility] but eventually cleared per patient  Subjective / 24h Interval events: He is feeling better this morning, appreciates has improved.  Denies any chest pain, no abdominal pain, no nausea or vomiting  Assessment & Plan: Principal Problem Acute hypoxic respiratory failure due to right upper lobe cavitating pneumonia as well as fluid overload/concern for new onset CHF.  Also with sepsis physiology due to pneumonia -Patient was started on broad-spectrum antibiotics with vancomycin and cefepime, given cavitary aspect on the CT scan we will switch to Zosyn for better anaerobic coverage.  Obtain MRSA PCR, if negative will discontinue vancomycin -Started on IV diuresis with Lasix 40 twice daily, cardiology was consulted and 2D echo is pending -Also on admission with bilateral lower extremity swelling, and Doppler ultrasound came back negative for DVT -Given weight loss, chills, weakness will obtain QuantiFERON however suspicion for TB is low given lack of risk factors.  Patient was in the [employer] and was tested often for TB -Weight loss somewhat concerning however he does have a history of alcohol abuse and does report poor p.o. intake over the last several months due to poor dentition.  [facility]--Per his  report he is now cleared.  We will have to treat his cavitary pneumonia and then probably will need a repeat CT scan in several weeks as an outpatient to truly rule out an underlying malignancy -Still on oxygen this morning, continue to wean off as tolerated  Macrocytic anemia likely in the setting of alcohol abuse -B12 is low at 152, start supplements with IM today and weekly, oral daily, folate level pending -Continue to monitor hemoglobin, no evidence of bleeding  Hypokalemia -Likely in the setting of poor p.o. intake, EtOH, replete.  Magnesium 1.8, will also give 2 g  Elevated troponin -No ACS symptoms, overall flat not in a pattern consistent with ACS.  Cardiology consulted as well.  Hypertension -continue home meds of amlodipine.  Labetalol as needed.  Blood pressure controlled this morning -Monitor blood pressure closely  Tobacco abuse -Patient refused nicotine patch -Counseled about cessation  Alcohol abuse  -Counseled about cessation -started on CIWA protocol -On seizure precautions -Did not trigger CIWA overnight, does not seem to be withdrawing this morning  Malnutrition -Albumin: 2.7.  -Consulted dietitian  Weight loss -Could be secondary to underlying lung malignancy?  Patient is current smoker, however as above [facility].  Recommend repeat CT scan of the chest once completion of his cavitary pneumonia antibiotic treatment   Scheduled Meds: . enoxaparin (LOVENOX) injection  40 mg Subcutaneous Q24H  . folic acid  1 mg Oral Daily  . furosemide  40 mg Intravenous Q12H  . losartan  50 mg Oral Daily  . multivitamin with minerals  1 tablet Oral Daily  . thiamine  100 mg Oral Daily  Continuous Infusions: . ceFEPime (MAXIPIME) IV 2 g (05/16/19 0446)  . vancomycin     PRN Meds:.acetaminophen **OR** acetaminophen, ipratropium-albuterol, labetalol, nitroGLYCERIN, ondansetron **OR** ondansetron (ZOFRAN) IV  DVT prophylaxis: Lovenox Code Status:  Full code Family Communication: d/w patient  Patient admitted from: home Anticipated d/c place: home Barriers to d/c: Ongoing need for IV diuresis, remains hypoxic weaning off oxygen, 2D echo pending, cardiology consultation ongoing  Consultants:  Cardiology  Procedures:  None   Microbiology  None   Antimicrobials: Vancomycin 4/5 >> Cefepime 4/5 >> 4/6 Zosyn 4/6 >>   Objective: Vitals:   05/15/19 1758 05/15/19 1938 05/16/19 0011 05/16/19 0304  BP: (!) 146/102 (!) 123/93 (!) 126/99 113/76  Pulse: (!) 124 (!) 127 (!) 108 (!) 106  Resp: 17 19 18 19   Temp: 98.2 F (36.8 C) (!) 97.5 F (36.4 C) 98.3 F (36.8 C) 98.9 F (37.2 C)  TempSrc: Oral Oral Oral Oral  SpO2: 99% 100% 98% 100%  Weight: 55.3 kg   54 kg  Height: 6\' 5"  (1.956 m)       Intake/Output Summary (Last 24 hours) at 05/16/2019 0619 Last data filed at 05/16/2019 0311 Gross per 24 hour  Intake 622.2 ml  Output 4600 ml  Net -3977.8 ml   Filed Weights   05/15/19 0806 05/15/19 1758 05/16/19 0304  Weight: 52.6 kg 55.3 kg 54 kg    Examination:  Constitutional: NAD Eyes: no scleral icterus ENMT: Mucous membranes are moist.  Neck: normal, supple Respiratory: Bibasilar rhonchi, no wheezing Cardiovascular: Regular rate and rhythm, no murmurs / rubs / gallops.  2+ lower extremity edema Abdomen: non distended, no tenderness. Bowel sounds positive.  Musculoskeletal: no clubbing / cyanosis.  Skin: no rashes Neurologic: CN 2-12 grossly intact. Strength 5/5 in all 4.  Psychiatric: Normal judgment and insight. Alert and oriented x 3. Normal mood.    Data Reviewed: I have independently reviewed following labs and imaging studies   CBC: Recent Labs  Lab 05/15/19 0816 05/15/19 1413 05/16/19 0438  WBC 7.4 7.4 6.3  NEUTROABS 5.2  --   --   HGB 10.3* 10.7* 9.6*  HCT 34.2* 35.3* 30.6*  MCV 114.0* 113.1* 109.7*  PLT 220 228 431   Basic Metabolic Panel: Recent Labs  Lab 05/15/19 0816 05/15/19 1413  05/16/19 0438  NA 139  --  142  K 3.4*  --  3.4*  CL 105  --  107  CO2 18*  --  25  GLUCOSE 92  --  84  BUN 6  --  6  CREATININE 0.72 0.76 0.98  CALCIUM 8.9  --  9.1  MG  --  1.8  --   PHOS  --  3.7  --    Liver Function Tests: Recent Labs  Lab 05/15/19 0816 05/16/19 0438  AST 19 19  ALT 11 10  ALKPHOS 75 58  BILITOT 0.7 1.4*  PROT 7.4 6.6  ALBUMIN 2.7* 2.4*   Coagulation Profile: No results for input(s): INR, PROTIME in the last 168 hours. HbA1C: No results for input(s): HGBA1C in the last 72 hours. CBG: No results for input(s): GLUCAP in the last 168 hours.  Recent Results (from the past 240 hour(s))  SARS CORONAVIRUS 2 (TAT 6-24 HRS) Nasopharyngeal Nasopharyngeal Swab     Status: None   Collection Time: 05/15/19  1:22 PM   Specimen: Nasopharyngeal Swab  Result Value Ref Range Status   SARS Coronavirus 2 NEGATIVE NEGATIVE Final    Comment: (NOTE) SARS-CoV-2 target nucleic  acids are NOT DETECTED. The SARS-CoV-2 RNA is generally detectable in upper and lower respiratory specimens during the acute phase of infection. Negative results do not preclude SARS-CoV-2 infection, do not rule out co-infections with other pathogens, and should not be used as the sole basis for treatment or other patient management decisions. Negative results must be combined with clinical observations, patient history, and epidemiological information. The expected result is Negative. Fact Sheet for Patients: HairSlick.no Fact Sheet for Healthcare Providers: quierodirigir.com This test is not yet approved or cleared by the [place] FDA and  has been authorized for detection and/or diagnosis of SARS-CoV-2 by FDA under an Emergency Use Authorization (EUA). This EUA will remain  in effect (meaning this test can be used) for the duration of the COVID-19 declaration under Section 56 4(b)(1) of the Act, 21 U.S.C. section 360bbb-3(b)(1),  unless the authorization is terminated or revoked sooner. Performed at [facility], [address], [place], [place] [address]   Culture, respiratory     Status: None (Preliminary result)   Collection Time: 05/15/19  1:22 PM   Specimen: SPU  Result Value Ref Range Status   Specimen Description SPUTUM  Final   Special Requests NONE  Final   Gram Stain   Final    MODERATE WBC PRESENT,BOTH PMN AND MONONUCLEAR MODERATE GRAM POSITIVE COCCI FEW GRAM POSITIVE RODS Performed at [facility], [address], [place], [place] [address]    Culture PENDING  Incomplete   Report Status PENDING  Incomplete     Radiology Studies: CT Angio Chest PE W and/or Wo Contrast  Result Date: 05/15/2019 CLINICAL DATA:  Shortness of breath. EXAM: CT ANGIOGRAPHY CHEST WITH CONTRAST TECHNIQUE: Multidetector CT imaging of the chest was performed using the standard protocol during bolus administration of intravenous contrast. Multiplanar CT image reconstructions and MIPs were obtained to evaluate the vascular anatomy. CONTRAST:  90mL OMNIPAQUE IOHEXOL 350 MG/ML SOLN COMPARISON:  December 07, 2018. FINDINGS: Cardiovascular: Satisfactory opacification of the pulmonary arteries to the segmental level. No evidence of pulmonary embolism. Normal heart size. No pericardial effusion. Atherosclerosis of thoracic aorta is noted. 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 Mediastinum/Nodes: No enlarged mediastinal, hilar, or axillary lymph nodes. Thyroid gland, trachea, and esophagus demonstrate no significant findings. Lungs/Pleura: No pneumothorax is noted. Small bilateral pleural effusions are noted. Mild bibasilar subsegmental atelectasis is noted. Mild emphysematous disease is noted in both upper lobes. There is new large right upper lobe opacity concerning for cavitating pneumonia measuring 5.2 x 3.1 cm. Upper Abdomen: No acute abnormality. Musculoskeletal: No  chest wall abnormality. No acute or significant osseous findings. Review of the MIP images confirms the above findings. IMPRESSION: 1. No definite evidence of pulmonary embolus. 2. Small bilateral pleural effusions are noted with mild bibasilar subsegmental atelectasis. 3. Large right upper lobe opacity is noted concerning for cavitating pneumonia. Follow-up CT scan in 2-3 weeks after treatment is recommended to ensure resolution and rule out underlying malignancy. 4. 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 Ascending thoracic aortic aneurysm. Recommend semi-annual imaging followup by CTA or MRA and referral to cardiothoracic surgery if not already obtained. This recommendation follows 2010 ACCF/AHA/AATS/ACR/ASA/SCA/SCAI/SIR/STS/SVM Guidelines for the Diagnosis and Management of Patients With Thoracic Aortic Disease. Circulation. 2010; 121: F818-E993. Aortic aneurysm NOS (ICD10-I71.9). 5. Aortic root is aneurysmal measuring 5.1 cm in diameter. Aortic Atherosclerosis (ICD10-I70.0) and Emphysema (ICD10-J43.9). Electronically Signed   By: Zenda Alpers.D.  On: 05/15/2019 11:08   DG Chest Portable 1 View  Result Date: 05/15/2019 CLINICAL DATA:  Cough shortness of breath bilateral leg swelling EXAM: PORTABLE CHEST 1 VIEW COMPARISON:  07/22/2018 FINDINGS: Cardiomediastinal contours are stable. Hilar structures are unremarkable. Increased opacity in the left lung base and at the right lung base compared to the previous study. Crescentic area of increased density over the right lung apex. This is new from 07/22/2018 Subacute rib fractures along the right lateral chest. Also with evidence of prior rib fractures on the left similar to CT of 12/07/2018. Visualized skeletal structures are otherwise unremarkable. IMPRESSION: 1. Increased opacity in the left lung base and at the right lung base compared to the previous study which may represent developing pneumonia. 2. Crescentic area of increased density over  the right lung apex which is new from 07/22/2018. Findings are of uncertain significance potentially related to pleural thickening or loculated pleural fluid. CT of the chest may be helpful for further assessment. 3. Bilateral rib fractures similar to prior CT of 12/07/2018 4. Emphysema as before. Electronically Signed   By: Donzetta Kohut M.D.   On: 05/15/2019 08:21   VAS [place] LOWER EXTREMITY VENOUS (DVT) (ONLY [facility] & [facility])  Result Date: 05/15/2019  Lower Venous DVTStudy Indications: Edema, and Swelling.  Comparison Study: No priors. Performing Technologist: Marilynne Halsted RDMS, RVT  Examination Guidelines: A complete evaluation includes B-mode imaging, spectral Doppler, color Doppler, and power Doppler as needed of all accessible portions of each vessel. Bilateral testing is considered an integral part of a complete examination. Limited examinations for reoccurring indications may be performed as noted. The reflux portion of the exam is performed with the patient in reverse Trendelenburg.  +---------+---------------+---------+-----------+----------+--------------+ RIGHT    CompressibilityPhasicitySpontaneityPropertiesThrombus Aging +---------+---------------+---------+-----------+----------+--------------+ CFV      Full           Yes      Yes                                 +---------+---------------+---------+-----------+----------+--------------+ SFJ      Full                                                        +---------+---------------+---------+-----------+----------+--------------+ FV Prox  Full                                                        +---------+---------------+---------+-----------+----------+--------------+ FV Mid   Full                                                        +---------+---------------+---------+-----------+----------+--------------+ FV DistalFull                                                         +---------+---------------+---------+-----------+----------+--------------+  PFV      Full                                                        +---------+---------------+---------+-----------+----------+--------------+ POP      Full           Yes      Yes                                 +---------+---------------+---------+-----------+----------+--------------+ PTV      Full                                                        +---------+---------------+---------+-----------+----------+--------------+ PERO     Full                                                        +---------+---------------+---------+-----------+----------+--------------+   +---------+---------------+---------+-----------+----------+--------------+ LEFT     CompressibilityPhasicitySpontaneityPropertiesThrombus Aging +---------+---------------+---------+-----------+----------+--------------+ CFV      Full           Yes      Yes                                 +---------+---------------+---------+-----------+----------+--------------+ SFJ      Full                                                        +---------+---------------+---------+-----------+----------+--------------+ FV Prox  Full                                                        +---------+---------------+---------+-----------+----------+--------------+ FV Mid   Full                                                        +---------+---------------+---------+-----------+----------+--------------+ FV DistalFull                                                        +---------+---------------+---------+-----------+----------+--------------+ PFV      Full                                                        +---------+---------------+---------+-----------+----------+--------------+  POP      Full           Yes      Yes                                  +---------+---------------+---------+-----------+----------+--------------+ PTV      Full                                                        +---------+---------------+---------+-----------+----------+--------------+ PERO     Full                                                        +---------+---------------+---------+-----------+----------+--------------+     Summary: RIGHT: - There is no evidence of deep vein thrombosis in the lower extremity.  - No cystic structure found in the popliteal fossa.  LEFT: - There is no evidence of deep vein thrombosis in the lower extremity.  - No cystic structure found in the popliteal fossa.  *See table(s) above for measurements and observations. Electronically signed by Sherald Hess MD on 05/15/2019 at 5:31:09 PM.    Final     Pamella Pert, MD, PhD [facility]  Between 7 am - 7 pm I am available, please contact me via Amion or Securechat  Between 7 pm - 7 am I am not available, please contact night coverage MD/APP via Amion

## 2019-05-16 NOTE — Progress Notes (Signed)
Pharmacy Antibiotic Note  Allen Saunders is a 59 y.o. male admitted on 05/15/2019 with pneumonia.  Pharmacy has been consulted for vancomycin and Zosyn dosing. Pt is afebrile and WBC is WNL. SCr is WNL but lactic acid is elevated.   Plan: Discontinue cefepime Continue vancomycin 750 mg IV every 24 hours Start zosyn 3.375 mg IV every 8 hours Monitor renal function and clinical status Obtain vancomycin peak/trough at [facility] Follow-up cultures for de-escalation and establish length of therapy  Height: 6\' 5"  (195.6 cm) Weight: 54 kg (119 lb) IBW/kg (Calculated) : 89.1  Temp (24hrs), Avg:98.1 F (36.7 C), Min:97.5 F (36.4 C), Max:98.9 F (37.2 C)  Recent Labs  Lab 05/15/19 0816 05/15/19 1026 05/15/19 1413 05/16/19 0438  WBC 7.4  --  7.4 6.3  CREATININE 0.72  --  0.76 0.98  LATICACIDVEN 4.6* 2.3*  --   --     Estimated Creatinine Clearance: 62.8 mL/min (by C-G formula based on SCr of 0.98 mg/dL).    Allergies  Allergen Reactions  . Asa [Aspirin] Other (See Comments)    Sore throat, gi upset    Antimicrobials this admission: Vanc 4/5>> Cefepime 4/5>>4/6 Zosyn 4/6>>  Dose adjustments this admission: N/A  Microbiology results: 4/5 MRSA PCR: pending 4/5 BCx x2: pending 4/5 COVID: neg 4/5 UCx: pending     Allen Saunders L. 07/16/19, PharmD [facility] PGY1 Pharmacy Resident [phone] 05/16/19     7:49 AM  Please check AMION for all [facility] Pharmacy phone numbers After 10:00 PM, call the Main Pharmacy [phone]

## 2019-05-16 NOTE — Progress Notes (Signed)
Initial Nutrition Assessment  DOCUMENTATION CODES:   Underweight, Severe malnutrition in context of chronic illness  INTERVENTION:   -Ensure Enlive po TID, each supplement provides 350 kcal and 20 grams of protein -Magic cup TID with meals, each supplement provides 290 kcal and 9 grams of protein -MVI with minerals daily  NUTRITION DIAGNOSIS:   Severe Malnutrition related to chronic illness(CHF, ETOH abuse) as evidenced by severe fat depletion, moderate muscle depletion, severe muscle depletion.  GOAL:   Patient will meet greater than or equal to 90% of their needs  MONITOR:   PO intake, Supplement acceptance, Diet advancement, Labs, Weight trends, Skin, I & O's  REASON FOR ASSESSMENT:   Consult Assessment of nutrition requirement/status  ASSESSMENT:   Allen Saunders is a 59 y.o. male with medical history significant of hypertension, thoracic aortic aneurysm, tobacco abuse, alcohol abuse presents to emergency department due to shortness of breath, leg swelling and a productive cough since 1 week.  Pt admitted with acute hypoxemic respiratory failure and sepsis.   Reviewed I/O's: -4 L x 24 hours  UOP: 3.4 L x 24 hours  Spoke with pt at bedside, who reports feeling much better this morning. He proudly shows this RD that he is no longer requiring oxygen and edema loss in lower extremities ("it was twice the size when I came in yesterday"). Pt reports he has a good appetite, consumed both a clear liquid tray as well as all of his scrambled eggs, grits, and bacon.   Pt shares that he lost a lot of weight several years ago when he developed difficulty swallowing ("I just couldn't eat"). Pt explains that he underwent an esophageal procedure as well an operation that removed some of his jaw bones, which resulted in some missing teeth. Pt reports that he was scheduled to pick up his new dentures yesterday, but was unable to obtain them as he came to the hospital for shortness of breath.  He denies difficult chewing foods given to him during hospitalization.   Pt shares he has a good appetite at home, consumes 3 meals per day ("everything and anything"). He also consumes 3 nutritional supplements per day, a combination of Ensure and TwoCal, which is provided to him through the [place]. He meets with an RD at the [place] and finds these visits helpful; he shares that he has gained about 8 pounds since has last doctors appointment, which he attributes to increased nutritional intake due to supplements. Pt often adds fruit or ice cream to vanilla flavored supplements to help promote acceptance. He is willing to continue Ensure supplements, but tired of the chocolate flavor ("I'm so done with it I can't even look at it").   Discussed importance of good meal and supplement intake to promote healing. Pt very appreciative of RD visit.   Medications reviewed and include folic acid, lasix, potassium chloride,  thiamine, vitamin B-12, and magnesium sulfate.   Labs reviewed: K: 3.4 (on PO supplementation). Phos and Mg WDL.   NUTRITION - FOCUSED PHYSICAL EXAM:    Most Recent Value  Orbital Region  Severe depletion  Upper Arm Region  Severe depletion  Thoracic and Lumbar Region  Severe depletion  Buccal Region  Severe depletion  Temple Region  Severe depletion  Clavicle Bone Region  Severe depletion  Clavicle and Acromion Bone Region  Severe depletion  Scapular Bone Region  Severe depletion  Dorsal Hand  Severe depletion  Patellar Region  Moderate depletion  Anterior Thigh Region  Moderate depletion  Posterior  Calf Region  Mild depletion  Edema (RD Assessment)  Mild  Hair  Reviewed  Eyes  Reviewed  Mouth  Reviewed  Skin  Reviewed  Nails  Reviewed       Diet Order:   Diet Order            Diet 2 gram sodium Room service appropriate? Yes; Fluid consistency: Thin  Diet effective now              EDUCATION NEEDS:   Education needs have been addressed  Skin:  Skin Assessment:  Reviewed RN Assessment  Last BM:  05/16/19  Height:   Ht Readings from Last 1 Encounters:  05/15/19 6\' 5"  (1.956 m)    Weight:   Wt Readings from Last 1 Encounters:  05/16/19 54 kg    Ideal Body Weight:  94.5 kg  BMI:  Body mass index is 14.11 kg/m.  Estimated Nutritional Needs:   Kcal:  2150-2350  Protein:  120-135 grams  Fluid:  > 2.1 L    01-17-1990, RD, LDN, CDCES Registered Dietitian II Certified Diabetes Care and Education Specialist Please refer to [facility] for RD and/or RD on-call/weekend/after hours pager

## 2019-05-17 DIAGNOSIS — Z720 Tobacco use: Secondary | ICD-10-CM

## 2019-05-17 DIAGNOSIS — I420 Dilated cardiomyopathy: Secondary | ICD-10-CM

## 2019-05-17 DIAGNOSIS — I5021 Acute systolic (congestive) heart failure: Secondary | ICD-10-CM

## 2019-05-17 LAB — CBC
HCT: 34.7 % — ABNORMAL LOW (ref 39.0–52.0)
Hemoglobin: 10.8 g/dL — ABNORMAL LOW (ref 13.0–17.0)
MCH: 33.6 pg (ref 26.0–34.0)
MCHC: 31.1 g/dL (ref 30.0–36.0)
MCV: 108.1 fL — ABNORMAL HIGH (ref 80.0–100.0)
Platelets: 245 10*3/uL (ref 150–400)
RBC: 3.21 MIL/uL — ABNORMAL LOW (ref 4.22–5.81)
RDW: 18.4 % — ABNORMAL HIGH (ref 11.5–15.5)
WBC: 7.4 10*3/uL (ref 4.0–10.5)
nRBC: 0 % (ref 0.0–0.2)

## 2019-05-17 LAB — COMPREHENSIVE METABOLIC PANEL
ALT: 11 U/L (ref 0–44)
AST: 16 U/L (ref 15–41)
Albumin: 2.5 g/dL — ABNORMAL LOW (ref 3.5–5.0)
Alkaline Phosphatase: 66 U/L (ref 38–126)
Anion gap: 11 (ref 5–15)
BUN: 9 mg/dL (ref 6–20)
CO2: 28 mmol/L (ref 22–32)
Calcium: 9.2 mg/dL (ref 8.9–10.3)
Chloride: 102 mmol/L (ref 98–111)
Creatinine, Ser: 1.2 mg/dL (ref 0.61–1.24)
GFR calc Af Amer: >60 mL/min (ref >60)
GFR calc non Af Amer: >60 mL/min (ref >60)
Glucose, Bld: 89 mg/dL (ref 70–99)
Potassium: 3.1 mmol/L — ABNORMAL LOW (ref 3.5–5.1)
Sodium: 141 mmol/L (ref 135–145)
Total Bilirubin: 1.1 mg/dL (ref 0.3–1.2)
Total Protein: 7.2 g/dL (ref 6.5–8.1)

## 2019-05-17 LAB — CULTURE, RESPIRATORY W GRAM STAIN: Culture: NORMAL

## 2019-05-17 LAB — PROCALCITONIN: Procalcitonin: <0.1 ng/mL

## 2019-05-17 LAB — LEGIONELLA PNEUMOPHILA SEROGP 1 UR AG: L. pneumophila Serogp 1 Ur Ag: NEGATIVE

## 2019-05-17 MED ORDER — METOPROLOL TARTRATE 25 MG PO TABS: 25.0000 mg | ORAL_TABLET | Freq: Two times a day (BID) | ORAL | Status: DC

## 2019-05-17 MED ORDER — POTASSIUM CHLORIDE CRYS ER 20 MEQ PO TBCR
40.0000 meq | EXTENDED_RELEASE_TABLET | ORAL | Status: AC
  Administered 2019-05-17 (×2): 40 meq via ORAL
  Filled 2019-05-17 (×2): qty 2

## 2019-05-17 MED ORDER — FUROSEMIDE 20 MG PO TABS: 20.0000 mg | ORAL_TABLET | Freq: Every day | ORAL | Status: DC

## 2019-05-17 MED ORDER — METOPROLOL TARTRATE 25 MG PO TABS
25.0000 mg | ORAL_TABLET | Freq: Two times a day (BID) | ORAL | Status: DC
  Administered 2019-05-17: 25 mg via ORAL
  Filled 2019-05-17: qty 1

## 2019-05-17 MED ORDER — METOPROLOL TARTRATE 100 MG PO TABS
100.0000 mg | ORAL_TABLET | Freq: Once | ORAL | Status: AC
  Administered 2019-05-17: 100 mg via ORAL
  Filled 2019-05-17: qty 1

## 2019-05-17 NOTE — Progress Notes (Addendum)
Progress Note  Patient Name: Allen Saunders Date of Encounter: 05/17/2019  Primary Cardiologist: New to [facility]  Subjective   Denies any chest pain or SOB  Inpatient Medications    Scheduled Meds: . enoxaparin (LOVENOX) injection  40 mg Subcutaneous Q24H  . feeding supplement (ENSURE ENLIVE)  237 mL Oral TID BM  . folic acid  1 mg Oral Daily  . furosemide  40 mg Intravenous Q12H  . hydrocortisone cream   Topical BID  . losartan  50 mg Oral Daily  . metoprolol tartrate  12.5 mg Oral BID  . multivitamin with minerals  1 tablet Oral Daily  . potassium chloride  40 mEq Oral Q4H  . thiamine  100 mg Oral Daily  . vitamin B-12  1,000 mcg Oral Daily   Continuous Infusions: . piperacillin-tazobactam (ZOSYN)  IV 3.375 g (05/17/19 0255)   PRN Meds: acetaminophen **OR** acetaminophen, ipratropium-albuterol, labetalol, nitroGLYCERIN, ondansetron **OR** ondansetron (ZOFRAN) IV   Vital Signs    Vitals:   05/16/19 1928 05/17/19 0007 05/17/19 0423 05/17/19 0748  BP: 105/82 109/78 122/78 (!) 125/92  Pulse: 96 87 89 80  Resp: 18 17 19 19   Temp: 97.8 F (36.6 C) 98.7 F (37.1 C) 98.8 F (37.1 C) 98.4 F (36.9 C)  TempSrc: Oral Oral Oral Oral  SpO2: 100% 95% 97% 98%  Weight:  54.3 kg    Height:        Intake/Output Summary (Last 24 hours) at 05/17/2019 0929 Last data filed at 05/17/2019 0600 Gross per 24 hour  Intake 1393.3 ml  Output 4200 ml  Net -2806.7 ml   Filed Weights   05/15/19 1758 05/16/19 0304 05/17/19 0007  Weight: 55.3 kg 54 kg 54.3 kg    Physical Exam   GEN: thin and cachetic appearing HEENT: Normal NECK: No JVD; No carotid bruits LYMPHATICS: No lymphadenopathy CARDIAC:RRR, no murmurs, rubs, gallops RESPIRATORY:  Clear to auscultation without rales, wheezing or rhonchi  ABDOMEN: Soft, non-tender, non-distended MUSCULOSKELETAL:  No edema; No deformity  SKIN: Warm and dry NEUROLOGIC:  Alert and oriented x 3 PSYCHIATRIC:  Normal affect    Labs      Chemistry Recent Labs  Lab 05/15/19 0816 05/15/19 0816 05/15/19 1413 05/16/19 0438 05/17/19 0348  NA 139  --   --  142 141  K 3.4*  --   --  3.4* 3.1*  CL 105  --   --  107 102  CO2 18*  --   --  25 28  GLUCOSE 92  --   --  84 89  BUN 6  --   --  6 9  CREATININE 0.72   < > 0.76 0.98 1.20  CALCIUM 8.9  --   --  9.1 9.2  PROT 7.4  --   --  6.6 7.2  ALBUMIN 2.7*  --   --  2.4* 2.5*  AST 19  --   --  19 16  ALT 11  --   --  10 11  ALKPHOS 75  --   --  58 66  BILITOT 0.7  --   --  1.4* 1.1  GFRNONAA >60   < > >60 >60 >60  GFRAA >60   < > >60 >60 >60  ANIONGAP 16*  --   --  10 11   < > = values in this interval not displayed.     Hematology Recent Labs  Lab 05/15/19 1413 05/16/19 0438 05/17/19 0348  WBC 7.4  6.3 7.4  RBC 3.12* 2.79* 3.21*  HGB 10.7* 9.6* 10.8*  HCT 35.3* 30.6* 34.7*  MCV 113.1* 109.7* 108.1*  MCH 34.3* 34.4* 33.6  MCHC 30.3 31.4 31.1  RDW 19.9* 19.4* 18.4*  PLT 228 224 245    Cardiac EnzymesNo results for input(s): TROPONINI in the last 168 hours. No results for input(s): TROPIPOC in the last 168 hours.   BNP Recent Labs  Lab 05/15/19 0816  BNP 2,123.1*     DDimer  Recent Labs  Lab 05/15/19 0816  DDIMER 1.49*     Radiology    CT Angio Chest PE W and/or Wo Contrast  Result Date: 05/15/2019 CLINICAL DATA:  Shortness of breath. EXAM: CT ANGIOGRAPHY CHEST WITH CONTRAST TECHNIQUE: Multidetector CT imaging of the chest was performed using the standard protocol during bolus administration of intravenous contrast. Multiplanar CT image reconstructions and MIPs were obtained to evaluate the vascular anatomy. CONTRAST:  90mL OMNIPAQUE IOHEXOL 350 MG/ML SOLN COMPARISON:  December 07, 2018. FINDINGS: Cardiovascular: Satisfactory opacification of the pulmonary arteries to the segmental level. No evidence of pulmonary embolism. Normal heart size. No pericardial effusion. Atherosclerosis of thoracic aorta is noted. 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 Mediastinum/Nodes: No enlarged mediastinal, hilar, or axillary lymph nodes. Thyroid gland, trachea, and esophagus demonstrate no significant findings. Lungs/Pleura: No pneumothorax is noted. Small bilateral pleural effusions are noted. Mild bibasilar subsegmental atelectasis is noted. Mild emphysematous disease is noted in both upper lobes. There is new large right upper lobe opacity concerning for cavitating pneumonia measuring 5.2 x 3.1 cm. Upper Abdomen: No acute abnormality. Musculoskeletal: No chest wall abnormality. No acute or significant osseous findings. Review of the MIP images confirms the above findings. IMPRESSION: 1. No definite evidence of pulmonary embolus. 2. Small bilateral pleural effusions are noted with mild bibasilar subsegmental atelectasis. 3. Large right upper lobe opacity is noted concerning for cavitating pneumonia. Follow-up CT scan in 2-3 weeks after treatment is recommended to ensure resolution and rule out underlying malignancy. 4. 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 Ascending thoracic aortic aneurysm. Recommend semi-annual imaging followup by CTA or MRA and referral to cardiothoracic surgery if not already obtained. This recommendation follows 2010 ACCF/AHA/AATS/ACR/ASA/SCA/SCAI/SIR/STS/SVM Guidelines for the Diagnosis and Management of Patients With Thoracic Aortic Disease. Circulation. 2010; 121: R416-L845. Aortic aneurysm NOS (ICD10-I71.9). 5. Aortic root is aneurysmal measuring 5.1 cm in diameter. Aortic Atherosclerosis (ICD10-I70.0) and Emphysema (ICD10-J43.9). Electronically Signed   By: Lupita Raider M.D.   On: 05/15/2019 11:08   ECHOCARDIOGRAM COMPLETE  Result Date: 05/16/2019    ECHOCARDIOGRAM REPORT   Patient Name:   Allen Saunders Date of Exam: 05/16/2019 Medical Rec #:  [id number]  Height:       77.0 in Accession #:    [id number] Weight:       119.0 lb Date of Birth:  1960-04-08  BSA:           1.794 m Patient Age:    58 years   BP:           125/94 mmHg Patient Gender: M          HR:           95 bpm. Exam Location:  Inpatient Procedure: 2D Echo, Cardiac Doppler and Color Doppler Indications:    Congestive Heart Failure 428.0  History:        Patient has no prior history of Echocardiogram examinations.  Risk Factors:Hypertension and Current Smoker. Elevated troponin.  Sonographer:    Renella Cunas RDCS Referring Phys: [id number] Kasandra Knudsen PAHWANI IMPRESSIONS  1. Left ventricular ejection fraction, by estimation, is 35 to 40%. The left ventricle has moderately decreased function. The left ventricle demonstrates global hypokinesis. Indeterminate diastolic filling due to E-A fusion.  2. Right ventricular systolic function is low normal. The right ventricular size is normal. Tricuspid regurgitation signal is inadequate for assessing PA pressure.  3. The mitral valve is normal in structure. Mild mitral valve regurgitation.  4. The aortic valve is tricuspid. Aortic valve regurgitation is mild. No aortic stenosis is present.  5. Aortic dilatation noted. There is moderate dilatation of the aortic root and of the ascending aorta measuring 46 mm.  6. The inferior vena cava is normal in size with greater than 50% respiratory variability, suggesting right atrial pressure of 3 mmHg. Comparison(s): No prior Echocardiogram. FINDINGS  Left Ventricle: Left ventricular ejection fraction, by estimation, is 35 to 40%. The left ventricle has moderately decreased function. The left ventricle demonstrates global hypokinesis. The left ventricular internal cavity size was normal in size. There is no left ventricular hypertrophy. Indeterminate diastolic filling due to E-A fusion. Right Ventricle: The right ventricular size is normal. No increase in right ventricular wall thickness. Right ventricular systolic function is low normal. Tricuspid regurgitation signal is inadequate for assessing PA pressure. Left Atrium: Left  atrial size was normal in size. Right Atrium: Right atrial size was normal in size. Pericardium: There is no evidence of pericardial effusion. Mitral Valve: The mitral valve is normal in structure. Mild mitral valve regurgitation, with centrally-directed jet. Tricuspid Valve: The tricuspid valve is normal in structure. Tricuspid valve regurgitation is not demonstrated. Aortic Valve: The aortic valve is tricuspid. Aortic valve regurgitation is mild. No aortic stenosis is present. Pulmonic Valve: The pulmonic valve was grossly normal. Pulmonic valve regurgitation is not visualized. Aorta: Aortic dilatation noted. There is moderate dilatation of the aortic root and of the ascending aorta measuring 46 mm. Venous: The inferior vena cava is normal in size with greater than 50% respiratory variability, suggesting right atrial pressure of 3 mmHg. IAS/Shunts: No atrial level shunt detected by color flow Doppler.  LEFT VENTRICLE PLAX 2D LVIDd:         3.80 cm      Diastology LVIDs:         3.30 cm      LV e' lateral:   6.73 cm/s LV PW:         1.10 cm      LV E/e' lateral: 9.0 LV IVS:        1.10 cm      LV e' medial:    6.41 cm/s LVOT diam:     2.50 cm      LV E/e' medial:  9.5 LV SV:         59 LV SV Index:   33 LVOT Area:     4.91 cm  LV Volumes (MOD) LV vol d, MOD A2C: 132.0 ml LV vol d, MOD A4C: 147.0 ml LV vol s, MOD A2C: 83.8 ml LV vol s, MOD A4C: 96.2 ml LV SV MOD A2C:     48.2 ml LV SV MOD A4C:     147.0 ml LV SV MOD BP:      46.4 ml RIGHT VENTRICLE RV S prime:     10.30 cm/s TAPSE (M-mode): 1.7 cm LEFT ATRIUM  Index       RIGHT ATRIUM           Index LA diam:      2.50 cm 1.39 cm/m  RA Area:     12.50 cm LA Vol (A2C): 37.2 ml 20.74 ml/m RA Volume:   26.00 ml  14.49 ml/m LA Vol (A4C): 27.5 ml 15.33 ml/m  AORTIC VALVE LVOT Vmax:   73.10 cm/s LVOT Vmean:  50.400 cm/s LVOT VTI:    0.121 m  AORTA Ao Root diam: 4.60 cm Ao Asc diam:  4.20 cm MITRAL VALVE MV Area (PHT): 6.43 cm    SHUNTS MV Decel Time: 118  msec    Systemic VTI:  0.12 m MR Peak grad: 61.3 mmHg    Systemic Diam: 2.50 cm MR Vmax:      391.50 cm/s MV E velocity: 60.80 cm/s MV A velocity: 58.30 cm/s MV E/A ratio:  1.04 Mihai Croitoru MD Electronically signed by Thurmon Fair MD Signature Date/Time: 05/16/2019/4:28:16 PM    Final    VAS [place] LOWER EXTREMITY VENOUS (DVT) (ONLY [facility] & [facility])  Result Date: 05/15/2019  Lower Venous DVTStudy Indications: Edema, and Swelling.  Comparison Study: No priors. Performing Technologist: Marilynne Halsted RDMS, RVT  Examination Guidelines: A complete evaluation includes B-mode imaging, spectral Doppler, color Doppler, and power Doppler as needed of all accessible portions of each vessel. Bilateral testing is considered an integral part of a complete examination. Limited examinations for reoccurring indications may be performed as noted. The reflux portion of the exam is performed with the patient in reverse Trendelenburg.  +---------+---------------+---------+-----------+----------+--------------+ RIGHT    CompressibilityPhasicitySpontaneityPropertiesThrombus Aging +---------+---------------+---------+-----------+----------+--------------+ CFV      Full           Yes      Yes                                 +---------+---------------+---------+-----------+----------+--------------+ SFJ      Full                                                        +---------+---------------+---------+-----------+----------+--------------+ FV Prox  Full                                                        +---------+---------------+---------+-----------+----------+--------------+ FV Mid   Full                                                        +---------+---------------+---------+-----------+----------+--------------+ FV DistalFull                                                        +---------+---------------+---------+-----------+----------+--------------+ PFV      Full                                                         +---------+---------------+---------+-----------+----------+--------------+  POP      Full           Yes      Yes                                 +---------+---------------+---------+-----------+----------+--------------+ PTV      Full                                                        +---------+---------------+---------+-----------+----------+--------------+ PERO     Full                                                        +---------+---------------+---------+-----------+----------+--------------+   +---------+---------------+---------+-----------+----------+--------------+ LEFT     CompressibilityPhasicitySpontaneityPropertiesThrombus Aging +---------+---------------+---------+-----------+----------+--------------+ CFV      Full           Yes      Yes                                 +---------+---------------+---------+-----------+----------+--------------+ SFJ      Full                                                        +---------+---------------+---------+-----------+----------+--------------+ FV Prox  Full                                                        +---------+---------------+---------+-----------+----------+--------------+ FV Mid   Full                                                        +---------+---------------+---------+-----------+----------+--------------+ FV DistalFull                                                        +---------+---------------+---------+-----------+----------+--------------+ PFV      Full                                                        +---------+---------------+---------+-----------+----------+--------------+ POP      Full           Yes      Yes                                 +---------+---------------+---------+-----------+----------+--------------+  PTV      Full                                                         +---------+---------------+---------+-----------+----------+--------------+ PERO     Full                                                        +---------+---------------+---------+-----------+----------+--------------+     Summary: RIGHT: - There is no evidence of deep vein thrombosis in the lower extremity.  - No cystic structure found in the popliteal fossa.  LEFT: - There is no evidence of deep vein thrombosis in the lower extremity.  - No cystic structure found in the popliteal fossa.  *See table(s) above for measurements and observations. Electronically signed by Sherald Hess MD on 05/15/2019 at 5:31:09 PM.    Final    Telemetry    NSR to ST with HR 80-110bpm with PACs- Personally Reviewed  ECG    No new tracing as of 05/16/19- Personally Reviewed  Cardiac Studies   ECHO: 12/10/2016:  Scanned document: LVEF at 55 to 60% with grade 2 DD.  There was evidence of moderate aortic root dilation at 4.3 cm at that time.  CATH: 10/03/2011:  1. Left main; normal  2. LAD; normal 3. Left circumflex; normal and nondominant.  4. Right coronary artery; normal and dominant 5. Left ventriculography; RAO left ventriculogram was performed using  25 cc of Visipaque dye at 12 cc per second. There were no wall motion abnormalities.the EF was estimated visually at 60%.  IMPRESSION:Allen Saunders has normal coronary arteries and normal left ventricular function. Believe his chest pain is chest wall from coughing and his EKG or represents early repolarization pattern. Continued medical therapy will be recommended. The sheath was removed and it a TR band is placed on the right wrist chief patent hemostasis. The patient left the Cath Lab in stable condition. Initially hydrated and discharged home in the morning.  2D echo 05/16/2019 IMPRESSIONS   1. Left ventricular ejection fraction, by estimation, is 35 to 40%. The  left ventricle has moderately decreased function. The left ventricle  demonstrates  global hypokinesis. Indeterminate diastolic filling due to  E-A fusion.  2. Right ventricular systolic function is low normal. The right  ventricular size is normal. Tricuspid regurgitation signal is inadequate  for assessing PA pressure.  3. The mitral valve is normal in structure. Mild mitral valve  regurgitation.  4. The aortic valve is tricuspid. Aortic valve regurgitation is mild. No  aortic stenosis is present.  5. Aortic dilatation noted. There is moderate dilatation of the aortic  root and of the ascending aorta measuring 46 mm.  6. The inferior vena cava is normal in size with greater than 50%  respiratory variability, suggesting right atrial pressure of 3 mmHg.   Patient Profile     59 y.o. male  with a hx of hypertension, thoracic aortic aneurysm, aortic root aneurysm, tobacco and alcohol abuse who is being seen today for the evaluation of acute CHF at the request of Dr Jacqulyn Bath.  Assessment & Plan    1.  Acute systolic CHF: -CXR with increased  opacity in the left lung and right lung base compared to previous imaging representing developing PNA -BNP on presentation >2000 with LE edema and orthopnea symptoms consistent with presumed systolic dysfunction -Echo with moderate LV dysfunction with EF 35-40% -HST found to be mildly elevated at 44 with repeat at 55 not necessarily consistent with ACS given significant fluid volume overload and presumed systolic CHF -Started on IV Lasix 40 mg twice daily -he put out 4.2L yesterday and is net neg 6.3L since admit -he appears euvolemic on exam today and creatinine has bumped mildly (0.98>1.2) -Continue losartan 50 mg p.o. daily -increase Lopressor to 25mg  BID for better rate control -stop Lasix and change to PO starting tomorrow -Weight, 119lb today   2. 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 daily  3. Sinus tachycardia: -TSH normal  -likely related to underlying LV dysfunction -increase Lopressor to 25mg  BID  3.  Unintentional weight loss: -Pt reports unintentional weight loss over the last year>>185lb down to 116lb  -He states he has had poor oral intake secondary to difficulty swallowing however concerning given CAP PNA treated with outpatient oral antibiotics for 6 weeks without resolution.  CTA performed during this hospitalization with right upper lobe opacity concerning for cavitating pneumonia measuring 5.2 x 3.1 cm with recommendations for follow-up CT scan in 2 to 3 weeks after treatment to ensure resolution and rule out underlying lung malignancy. -Albumin, 2.7 -TSH found to be 2.063>>WNL  -Management per primary team  4. Hypertensive urgency: -Patient found to be hypertensive on admission -Improved today 120/75>113/76 -On PTA amlodipine 10mg  PO QD -Amlodipine stopped>losartan 50mg  PO QD initiated   -Monitor BP closely. May need further antihypertensive titration  5.  Tobacco and alcohol abuse: -Smoking cessation strongly encouraged -Reports 8-12 beers per day -CIWA protocol per primary team  6.  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  > 50% of time was spent in direct patient care.     Signed, , MD [facility] Pager: [phone] 05/17/2019, 9:29 AM     For questions or updates, please contact   Please consult [email or web address] for contact info under Cardiology/STEMI.

## 2019-05-17 NOTE — Progress Notes (Signed)
Unable to get HR low enough for coronary CTA.  CT cancelled.  Will make NPO after MN for nuclear stress test in am.

## 2019-05-17 NOTE — Progress Notes (Signed)
PROGRESS NOTE   Allen Saunders  FTD:[id number]    DOB: 11-26-1960    DOA: 05/15/2019  PCP: [name], MD   I have briefly reviewed patients previous medical records in [facility].  Chief Complaint:   Chief Complaint  Patient presents with  . Shortness of Breath    Brief Narrative:  59 year old married male, moves around with the help of a cane/walker and a scooter at home, PMH of hypertension, thoracic aortic aneurysm, tobacco and alcohol abuse presented with progressive dyspnea, associated bilateral leg swelling, orthopnea, cough productive of pinkish-red sputum.  He also reported chills, intermittent chest pain, generalized weakness, lethargy and night sweats.  He was evaluated by his PCP at the [place] and he was also referred to oncology at the [facility] but eventually cleared per patient.   Assessment & Plan:  Principal Problem:   Acute hypoxemic respiratory failure (HCC) Active Problems:   Hypertension   Thoracic aortic aneurysm, without rupture (HCC)   Hypokalemia   Elevated troponin   Lactic acidosis   Macrocytic anemia   Fluid overload   Cavitary pneumonia   Tobacco abuse   Alcohol abuse   Leg edema   Weight loss   Malnutrition (HCC)   Peripheral edema   Sinus tachycardia   Protein-calorie malnutrition, severe   Acute hypoxic respiratory failure due to right upper lobe cavitary pneumonia and new onset acute systolic CHF/sepsis due to pneumonia, POA: Initially started on vancomycin and Zosyn for cavitary pneumonia seen on CT.  However for better anaerobic coverage, cefepime was switched to Zosyn.  MRSA PCR negative and hence discontinued vancomycin today.  TTE: LVEF 35-40% with global hypokinesis.  Treated with IV Lasix 40 mg twice daily.  -5.8 L thus far.  Cardiology input appreciated, discussed with Dr. Mayford Knife 4/8.  Appears euvolemic and hence transitioned to oral Lasix 20 mg starting tomorrow.  Increase Lopressor to 25 mg twice daily.  Continue losartan 50 mg daily.   Coronary CTA was initially planned but since unable to get heart rate adequately low, now plans for nuclear stress test on 4/8.  Hypoxia has resolved.  Bilateral lower extremity venous Dopplers negative for DVT.  Since patient presented with weight loss, chills, weakness, TB QuantiFERON gold requested and pending although low index of suspicion.  Patient reportedly was in the [employer] and tested often for TB.   Hypokalemia: Replace aggressively and follow.  Magnesium normal.  Weight loss: Concerning.  She does have history of alcohol abuse but reportedly quit 2 months PTA.  He reports poor oral intake over the last several months due to poor dentition.  He apparently was evaluated by oncology at the [facility] and per his report he is now cleared.  He will need to be treated for cavitary pneumonia and will need repeat CTA in several weeks to reassess and rule out underlying malignancy.  Macrocytic anemia/B12 deficiency: Stable.  May be related to prior alcohol abuse and B12 deficiency.  Continue B12 supplements IM and then orally.  Elevated troponin: No anginal symptoms.  Less likely due to ACS.  Likely due to demand ischemia from hypoxia, pneumonia and acute CHF.  Cardiology on board and planning stress testing tomorrow.  Essential hypertension: Controlled and even soft blood pressures at time.  Amlodipine now discontinued.  Continue metoprolol and losartan.  Tobacco abuse: Nicotine patch declined by patient.  Cessation counseled.  Alcohol abuse: Repeatedly states that he has quit 2 months ago and appears frustrated to discuss.  No withdrawal features noted.  Body mass index is 14.19 kg/m.  Nutritional Status Nutrition Problem: Severe Malnutrition Etiology: chronic illness(CHF, ETOH abuse) Signs/Symptoms: severe fat depletion, moderate muscle depletion, severe muscle depletion Interventions: Ensure Enlive (each supplement provides 350kcal and 20 grams of protein), Magic cup, MVI  DVT prophylaxis:  Lovenox Code Status: Full Family Communication: None at bedside Disposition:  . Patient came from: Home           . Anticipated d/c place: Home . Barriers to d/c: Ongoing cardiac work-up for acute systolic CHF and newly diagnosed cardiomyopathy, plan for stress testing 4/8   Consultants:   Cardiology  Procedures:   None  Antimicrobials:   IV cefepime x3 doses on 4/5 IV Zosyn 4/6 > IV vancomycin 4/5-4/6   Subjective:  Patient reports feeling better.  No dyspnea or chest pain at rest.  Reports some dyspnea on exertion.  Leg edema resolved.  No other complaints reported.  Objective:   Vitals:   05/17/19 0423 05/17/19 0748 05/17/19 1229 05/17/19 1751  BP: 122/78 (!) 125/92 106/74 95/67  Pulse: 89 80 74 74  Resp: 19 19 19 18   Temp: 98.8 F (37.1 C) 98.4 F (36.9 C) 98.7 F (37.1 C) (!) 97.4 F (36.3 C)  TempSrc: Oral Oral Oral Oral  SpO2: 97% 98% 98% 100%  Weight:      Height:        General exam: Pleasant young male, moderately built and nourished lying comfortably propped up in bed without distress. Respiratory system: Clear to auscultation. Respiratory effort normal. Cardiovascular system: S1 & S2 heard, RRR. No JVD, murmurs, rubs, gallops or clicks. No pedal edema.  Telemetry personally reviewed: Sinus rhythm. Gastrointestinal system: Abdomen is nondistended, soft and nontender. No organomegaly or masses felt. Normal bowel sounds heard. Central nervous system: Alert and oriented. No focal neurological deficits. Extremities: Symmetric 5 x 5 power. Skin: No rashes, lesions or ulcers Psychiatry: Judgement and insight appear normal. Mood & affect appropriate.     Data Reviewed:   I have personally reviewed following labs and imaging studies   CBC: Recent Labs  Lab 05/15/19 0816 05/15/19 1229 05/15/19 1413 05/16/19 0438 05/17/19 0348  WBC 7.4  --  7.4 6.3 7.4  NEUTROABS 5.2  --   --   --   --   HGB 10.3*  --  10.7* 9.6* 10.8*  HCT 34.2*   < > 35.3* 30.6*  34.7*  MCV 114.0*  --  113.1* 109.7* 108.1*  PLT 220  --  228 224 245   < > = values in this interval not displayed.    Basic Metabolic Panel: Recent Labs  Lab 05/15/19 0816 05/15/19 0816 05/15/19 1413 05/16/19 0438 05/17/19 0348  NA 139  --   --  142 141  K 3.4*  --   --  3.4* 3.1*  CL 105  --   --  107 102  CO2 18*  --   --  25 28  GLUCOSE 92  --   --  84 89  BUN 6  --   --  6 9  CREATININE 0.72   < > 0.76 0.98 1.20  CALCIUM 8.9  --   --  9.1 9.2  MG  --   --  1.8  --   --   PHOS  --   --  3.7  --   --    < > = values in this interval not displayed.    Liver Function Tests: Recent Labs  Lab 05/15/19  2409 05/16/19 0438 05/17/19 0348  AST 19 19 16   ALT 11 10 11   ALKPHOS 75 58 66  BILITOT 0.7 1.4* 1.1  PROT 7.4 6.6 7.2  ALBUMIN 2.7* 2.4* 2.5*    CBG: No results for input(s): GLUCAP in the last 168 hours.  Microbiology Studies:   Recent Results (from the past 240 hour(s))  Blood culture (routine x 2)     Status: None (Preliminary result)   Collection Time: 05/15/19  8:13 AM   Specimen: BLOOD  Result Value Ref Range Status   Specimen Description BLOOD RIGHT ANTECUBITAL  Final   Special Requests   Final    BOTTLES DRAWN AEROBIC AND ANAEROBIC Blood Culture results may not be optimal due to an inadequate volume of blood received in culture bottles   Culture   Final    NO GROWTH 2 DAYS Performed at [facility], [address], [place], [address] [place]    Report Status PENDING  Incomplete  Blood culture (routine x 2)     Status: None (Preliminary result)   Collection Time: 05/15/19  8:18 AM   Specimen: BLOOD  Result Value Ref Range Status   Specimen Description BLOOD SITE NOT SPECIFIED  Final   Special Requests   Final    BOTTLES DRAWN AEROBIC AND ANAEROBIC Blood Culture adequate volume   Culture   Final    NO GROWTH 2 DAYS Performed at [facility], [address], [place], [address] [place]    Report Status PENDING  Incomplete  Urine  culture     Status: None   Collection Time: 05/15/19  8:56 AM   Specimen: Urine, Random  Result Value Ref Range Status   Specimen Description URINE, RANDOM  Final   Special Requests NONE  Final   Culture   Final    NO GROWTH Performed at [facility], [address] [place], [address] [place]    Report Status 05/16/2019 FINAL  Final  SARS CORONAVIRUS 2 (TAT 6-24 HRS) Nasopharyngeal Nasopharyngeal Swab     Status: None   Collection Time: 05/15/19  1:22 PM   Specimen: Nasopharyngeal Swab  Result Value Ref Range Status   SARS Coronavirus 2 NEGATIVE NEGATIVE Final    Comment: (NOTE) SARS-CoV-2 target nucleic acids are NOT DETECTED. The SARS-CoV-2 RNA is generally detectable in upper and lower respiratory specimens during the acute phase of infection. Negative results do not preclude SARS-CoV-2 infection, do not rule out co-infections with other pathogens, and should not be used as the sole basis for treatment or other patient management decisions. Negative results must be combined with clinical observations, patient history, and epidemiological information. The expected result is Negative. Fact Sheet for Patients: 07/16/2019 Fact Sheet for Healthcare Providers: 07/15/19 This test is not yet approved or cleared by the [place] FDA and  has been authorized for detection and/or diagnosis of SARS-CoV-2 by FDA under an Emergency Use Authorization (EUA). This EUA will remain  in effect (meaning this test can be used) for the duration of the COVID-19 declaration under Section 56 4(b)(1) of the Act, 21 U.S.C. section 360bbb-3(b)(1), unless the authorization is terminated or revoked sooner. Performed at [facility], [address], [place], [address] [place]   Culture, respiratory     Status: None   Collection Time: 05/15/19  1:22 PM   Specimen: SPU  Result Value Ref Range Status   Specimen Description  SPUTUM  Final   Special Requests NONE  Final   Gram Stain   Final    MODERATE WBC PRESENT,BOTH PMN AND MONONUCLEAR MODERATE GRAM POSITIVE COCCI FEW GRAM POSITIVE RODS    Culture   Final    FEW Consistent with normal respiratory flora. Performed at [facility], [place] [address] [place], [place] [address]    Report Status 05/17/2019 FINAL  Final  MRSA PCR Screening     Status: None   Collection Time: 05/16/19 10:40 AM   Specimen: Nasopharyngeal  Result Value Ref Range Status   MRSA by PCR NEGATIVE NEGATIVE Final    Comment:        The GeneXpert MRSA Assay (FDA approved for NASAL specimens only), is one component of a comprehensive MRSA colonization surveillance program. It is not intended to diagnose MRSA infection nor to guide or monitor treatment for MRSA infections. Performed at [facility], [place] [address], [place], [place] [address]      Radiology Studies:  No results found.   Scheduled Meds:   . enoxaparin (LOVENOX) injection  40 mg Subcutaneous Q24H  . feeding supplement (ENSURE ENLIVE)  237 mL Oral TID BM  . folic acid  1 mg Oral Daily  . [START ON 05/18/2019] furosemide  20 mg Oral Daily  . hydrocortisone cream   Topical BID  . losartan  50 mg Oral Daily  . metoprolol tartrate  25 mg Oral BID  . multivitamin with minerals  1 tablet Oral Daily  . thiamine  100 mg Oral Daily  . vitamin B-12  1,000 mcg Oral Daily    Continuous Infusions:   . piperacillin-tazobactam (ZOSYN)  IV 3.375 g (05/17/19 1441)     LOS: 2 days     Vernell Leep, MD, [place], [facility]. [facility]    To contact the attending provider between 7A-7P or the covering provider during after hours 7P-7A, please log into the web site [email or web address] and access using universal [place] password for that web site. If you do not have the password, please call the hospital operator.  05/17/2019, 6:15 PM

## 2019-05-17 NOTE — Progress Notes (Signed)
MRSA PCR neg. Ok to [facility] per Dr. Waymon Amato.  Ulyses Southward, PharmD, BCIDP, AAHIVP, CPP Infectious Disease Pharmacist 05/17/2019 9:28 AM

## 2019-05-17 NOTE — Plan of Care (Signed)
  Problem: Education: Goal: Knowledge of General Education information will improve Description Including pain rating scale, medication(s)/side effects and non-pharmacologic comfort measures Outcome: Progressing   

## 2019-05-18 ENCOUNTER — Inpatient Hospital Stay (HOSPITAL_COMMUNITY): Payer: 59

## 2019-05-18 DIAGNOSIS — I420 Dilated cardiomyopathy: Secondary | ICD-10-CM

## 2019-05-18 LAB — NM MYOCAR MULTI W/SPECT W/WALL MOTION / EF
Estimated workload: 1 METS
Exercise duration (min): 5 min
Exercise duration (sec): 17 s
MPHR: 162 {beats}/min
Peak HR: 86 {beats}/min
Percent HR: 55 %
Rest HR: 63 {beats}/min

## 2019-05-18 LAB — QUANTIFERON-TB GOLD PLUS (RQFGPL)
QuantiFERON Mitogen Value: >10 IU/mL
QuantiFERON Nil Value: 0.04 IU/mL
QuantiFERON TB1 Ag Value: 0.07 IU/mL
QuantiFERON TB2 Ag Value: 0.11 IU/mL

## 2019-05-18 LAB — BASIC METABOLIC PANEL
Anion gap: 10 (ref 5–15)
BUN: 10 mg/dL (ref 6–20)
CO2: 26 mmol/L (ref 22–32)
Calcium: 9 mg/dL (ref 8.9–10.3)
Chloride: 103 mmol/L (ref 98–111)
Creatinine, Ser: 1.32 mg/dL — ABNORMAL HIGH (ref 0.61–1.24)
GFR calc Af Amer: >60 mL/min (ref >60)
GFR calc non Af Amer: 59 mL/min — ABNORMAL LOW (ref >60)
Glucose, Bld: 84 mg/dL (ref 70–99)
Potassium: 3.8 mmol/L (ref 3.5–5.1)
Sodium: 139 mmol/L (ref 135–145)

## 2019-05-18 LAB — QUANTIFERON-TB GOLD PLUS: QuantiFERON-TB Gold Plus: NEGATIVE

## 2019-05-18 MED ORDER — ADULT MULTIVITAMIN W/MINERALS CH
1.0000 | ORAL_TABLET | Freq: Every day | ORAL | Status: DC
  Administered 2019-05-18 – 2019-05-20 (×3): 1 via ORAL
  Filled 2019-05-18 (×3): qty 1

## 2019-05-18 MED ORDER — PIPERACILLIN-TAZOBACTAM 3.375 G IVPB
3.3750 g | Freq: Three times a day (TID) | INTRAVENOUS | Status: DC
  Administered 2019-05-18 – 2019-05-20 (×7): 3.375 g via INTRAVENOUS
  Filled 2019-05-18 (×8): qty 50

## 2019-05-18 MED ORDER — REGADENOSON 0.4 MG/5ML IV SOLN
0.4000 mg | Freq: Once | INTRAVENOUS | Status: AC
  Administered 2019-05-18: 0.4 mg via INTRAVENOUS
  Filled 2019-05-18: qty 5

## 2019-05-18 MED ORDER — THIAMINE HCL 100 MG PO TABS
100.0000 mg | ORAL_TABLET | Freq: Every day | ORAL | Status: DC
  Administered 2019-05-18 – 2019-05-20 (×3): 100 mg via ORAL
  Filled 2019-05-18 (×3): qty 1

## 2019-05-18 MED ORDER — METOPROLOL TARTRATE 25 MG PO TABS
25.0000 mg | ORAL_TABLET | Freq: Two times a day (BID) | ORAL | Status: DC
  Administered 2019-05-18 (×2): 25 mg via ORAL
  Filled 2019-05-18 (×2): qty 1

## 2019-05-18 MED ORDER — VITAMIN B-12 1000 MCG PO TABS
1000.0000 ug | ORAL_TABLET | Freq: Every day | ORAL | Status: DC
  Administered 2019-05-19 – 2019-05-20 (×2): 1000 ug via ORAL
  Filled 2019-05-18 (×2): qty 1

## 2019-05-18 MED ORDER — LOSARTAN POTASSIUM 50 MG PO TABS
50.0000 mg | ORAL_TABLET | Freq: Every day | ORAL | Status: DC
  Administered 2019-05-18 – 2019-05-19 (×2): 50 mg via ORAL
  Filled 2019-05-18 (×2): qty 1

## 2019-05-18 MED ORDER — FUROSEMIDE 20 MG PO TABS
20.0000 mg | ORAL_TABLET | Freq: Every day | ORAL | Status: DC
  Administered 2019-05-18 – 2019-05-20 (×3): 20 mg via ORAL
  Filled 2019-05-18 (×3): qty 1

## 2019-05-18 MED ORDER — TECHNETIUM TC 99M TETROFOSMIN IV KIT
10.9000 | PACK | Freq: Once | INTRAVENOUS | Status: AC | PRN
  Administered 2019-05-18: 10.9 via INTRAVENOUS

## 2019-05-18 MED ORDER — REGADENOSON 0.4 MG/5ML IV SOLN
INTRAVENOUS | Status: AC
  Filled 2019-05-18: qty 5

## 2019-05-18 MED ORDER — TECHNETIUM TC 99M TETROFOSMIN IV KIT
30.5000 | PACK | Freq: Once | INTRAVENOUS | Status: AC | PRN
  Administered 2019-05-18: 30.5 via INTRAVENOUS

## 2019-05-18 MED ORDER — FUROSEMIDE 20 MG PO TABS
20.0000 mg | ORAL_TABLET | Freq: Every day | ORAL | Status: DC
  Filled 2019-05-18: qty 1

## 2019-05-18 MED ORDER — FUROSEMIDE 10 MG/ML IJ SOLN
INTRAMUSCULAR | Status: AC
  Filled 2019-05-18: qty 4

## 2019-05-18 NOTE — Progress Notes (Signed)
PROGRESS NOTE   Allen Saunders  JSH:[id number]    DOB: 05-Mar-1960    DOA: 05/15/2019  PCP: [name], MD   I have briefly reviewed patients previous medical records in [facility].  Chief Complaint:   Chief Complaint  Patient presents with   Shortness of Breath    Brief Narrative:  59 year old married male, moves around with the help of a cane/walker and a scooter at home, PMH of hypertension, thoracic aortic aneurysm, tobacco and alcohol abuse presented with progressive dyspnea, associated bilateral leg swelling, orthopnea, cough productive of pinkish-red sputum.  He also reported chills, intermittent chest pain, generalized weakness, lethargy and night sweats.  He was evaluated by his PCP at the [place] and he was also referred to oncology at the [facility] but eventually cleared per patient.   Assessment & Plan:  Principal Problem:   Acute hypoxemic respiratory failure (HCC) Active Problems:   Hypertension   Thoracic aortic aneurysm, without rupture (HCC)   Hypokalemia   Elevated troponin   Lactic acidosis   Macrocytic anemia   Fluid overload   Cavitary pneumonia   Tobacco abuse   Alcohol abuse   Leg edema   Weight loss   Malnutrition (HCC)   Peripheral edema   Sinus tachycardia   Protein-calorie malnutrition, severe   Acute hypoxic respiratory failure due to right upper lobe cavitary pneumonia and new onset acute systolic CHF/sepsis due to pneumonia, POA: Initially started on vancomycin and Zosyn for cavitary pneumonia seen on CT.  However for better anaerobic coverage, cefepime was switched to Zosyn.  MRSA PCR negative and hence discontinued vancomycin today.  TTE: LVEF 35-40% with global hypokinesis.  Treated with IV Lasix 40 mg twice daily.  -5.9 L thus far.  Cardiology input appreciated, discussed with Dr. Mayford Knife 4/8.  Appears euvolemic and hence transitioned to oral Lasix 20 mg 4/8.  Increased Lopressor to 25 mg twice daily.  Continue losartan 50 mg daily.  Coronary CTA  was initially planned but since unable to get heart rate adequately low.  Hypoxia has resolved.  Bilateral lower extremity venous Dopplers negative for DVT.  Since patient presented with weight loss, chills, weakness, TB QuantiFERON gold requested and pending although low index of suspicion.  Patient reportedly was in the [employer] and tested often for TB.   Nuclear stress test: No reversible ischemia or infarction. Hypokinesis of the septal wall. LVEF 38%. Currently on low-dose oral Lasix and ARB, creatinine has slightly gone up from 1.2-1.32, monitor BMP in a.m.Marland Kitchen Await cardiology follow-up regarding any further interventions regarding abnormal stress test  Hypokalemia: Replaced.  Magnesium normal.  Weight loss: Concerning.  He does have history of alcohol abuse but reportedly quit 2 months PTA.  He reports poor oral intake over the last several months due to poor dentition.  He apparently was evaluated by oncology at the [facility] and per his report he is now cleared.  He will need to be treated for cavitary pneumonia and will need repeat CTA in several weeks to reassess and rule out underlying malignancy.  Macrocytic anemia/B12 deficiency: Stable.  May be related to prior alcohol abuse and B12 deficiency.  Continue B12 supplements IM and then orally.  Elevated troponin: No anginal symptoms.  Less likely due to ACS.  Likely due to demand ischemia from hypoxia, pneumonia and acute CHF.  Cardiology on board and abnormal stress test noted, await further recommendations.  Essential hypertension: Controlled and even soft blood pressures at time.  Amlodipine now discontinued.  Continue  metoprolol and losartan.  Tobacco abuse: Nicotine patch declined by patient.  Cessation counseled.  Alcohol abuse: Repeatedly states that he has quit 2 months ago and appears frustrated to discuss.  No withdrawal features noted.  Body mass index is 13.86 kg/m.  Nutritional Status Nutrition Problem: Severe  Malnutrition Etiology: chronic illness(CHF, ETOH abuse) Signs/Symptoms: severe fat depletion, moderate muscle depletion, severe muscle depletion Interventions: Ensure Enlive (each supplement provides 350kcal and 20 grams of protein), Magic cup, MVI  DVT prophylaxis: Lovenox Code Status: Full Family Communication: Discussed in detail with patient's spouse and mother at bedside, updated care and answered questions. Disposition:   Patient came from: Home            Anticipated d/c place: Home  Barriers to d/c: Ongoing cardiac work-up for acute systolic CHF and newly diagnosed cardiomyopathy, course complicated by AKI. If no further inpatient evaluation plan by cardiology then may consider discharge home 4/9.   Consultants:   Cardiology  Procedures:   None  Antimicrobials:   IV cefepime x3 doses on 4/5 IV Zosyn 4/6 > IV vancomycin 4/5-4/6   Subjective:  Patient seen this afternoon after stress test. Indicated that he had some dyspnea during stress test but none at this time. No chest pain. No other complaints reported.  Objective:   Vitals:   05/18/19 1108 05/18/19 1110 05/18/19 1241 05/18/19 1546  BP: (!) 135/91 139/85 110/71 107/87  Pulse: 83 79 77 72  Resp:   20 20  Temp:   98.1 F (36.7 C) 97.8 F (36.6 C)  TempSrc:   Oral   SpO2:   92% 100%  Weight:      Height:        General exam: Pleasant young male, moderately built and nourished lying comfortably propped up in bed without distress. Respiratory system: Clear to auscultation. No increased work of breathing. Cardiovascular system: S1 and S2 heard, RRR. No JVD, murmurs or pedal edema. Telemetry personally reviewed: Sinus rhythm. Occasional mild sinus tachycardia. Gastrointestinal system: Abdomen is nondistended, soft and nontender. No organomegaly or masses felt. Normal bowel sounds heard. Central nervous system: Alert and oriented. No focal neurological deficits. Extremities: Symmetric 5 x 5 power. Skin: No  rashes, lesions or ulcers Psychiatry: Judgement and insight appear normal. Mood & affect appropriate.     Data Reviewed:   I have personally reviewed following labs and imaging studies   CBC: Recent Labs  Lab 05/15/19 0816 05/15/19 1229 05/15/19 1413 05/16/19 0438 05/17/19 0348  WBC 7.4  --  7.4 6.3 7.4  NEUTROABS 5.2  --   --   --   --   HGB 10.3*  --  10.7* 9.6* 10.8*  HCT 34.2*   < > 35.3* 30.6* 34.7*  MCV 114.0*  --  113.1* 109.7* 108.1*  PLT 220  --  228 224 245   < > = values in this interval not displayed.    Basic Metabolic Panel: Recent Labs  Lab 05/15/19 0816 05/15/19 1413 05/16/19 0438 05/17/19 0348 05/18/19 0459  NA   < >  --  142 141 139  K   < >  --  3.4* 3.1* 3.8  CL   < >  --  107 102 103  CO2   < >  --  25 28 26   GLUCOSE   < >  --  84 89 84  BUN   < >  --  6 9 10   CREATININE   < > 0.76 0.98 1.20 1.32*  CALCIUM   < >  --  9.1 9.2 9.0  MG  --  1.8  --   --   --   PHOS  --  3.7  --   --   --    < > = values in this interval not displayed.    Liver Function Tests: Recent Labs  Lab 05/15/19 0816 05/16/19 0438 05/17/19 0348  AST 19 19 16   ALT 11 10 11   ALKPHOS 75 58 66  BILITOT 0.7 1.4* 1.1  PROT 7.4 6.6 7.2  ALBUMIN 2.7* 2.4* 2.5*    CBG: No results for input(s): GLUCAP in the last 168 hours.  Microbiology Studies:   Recent Results (from the past 240 hour(s))  Blood culture (routine x 2)     Status: None (Preliminary result)   Collection Time: 05/15/19  8:13 AM   Specimen: BLOOD  Result Value Ref Range Status   Specimen Description BLOOD RIGHT ANTECUBITAL  Final   Special Requests   Final    BOTTLES DRAWN AEROBIC AND ANAEROBIC Blood Culture results may not be optimal due to an inadequate volume of blood received in culture bottles   Culture   Final    NO GROWTH 3 DAYS Performed at [facility], [address], [place], [address] [place]    Report Status PENDING  Incomplete  Blood culture (routine x 2)     Status: None  (Preliminary result)   Collection Time: 05/15/19  8:18 AM   Specimen: BLOOD  Result Value Ref Range Status   Specimen Description BLOOD SITE NOT SPECIFIED  Final   Special Requests   Final    BOTTLES DRAWN AEROBIC AND ANAEROBIC Blood Culture adequate volume   Culture   Final    NO GROWTH 3 DAYS Performed at [facility], [address], [place], [address] [place]    Report Status PENDING  Incomplete  Urine culture     Status: None   Collection Time: 05/15/19  8:56 AM   Specimen: Urine, Random  Result Value Ref Range Status   Specimen Description URINE, RANDOM  Final   Special Requests NONE  Final   Culture   Final    NO GROWTH Performed at [facility], [address] [place], [address] [place]    Report Status 05/16/2019 FINAL  Final  SARS CORONAVIRUS 2 (TAT 6-24 HRS) Nasopharyngeal Nasopharyngeal Swab     Status: None   Collection Time: 05/15/19  1:22 PM   Specimen: Nasopharyngeal Swab  Result Value Ref Range Status   SARS Coronavirus 2 NEGATIVE NEGATIVE Final    Comment: (NOTE) SARS-CoV-2 target nucleic acids are NOT DETECTED. The SARS-CoV-2 RNA is generally detectable in upper and lower respiratory specimens during the acute phase of infection. Negative results do not preclude SARS-CoV-2 infection, do not rule out co-infections with other pathogens, and should not be used as the sole basis for treatment or other patient management decisions. Negative results must be combined with clinical observations, patient history, and epidemiological information. The expected result is Negative. Fact Sheet for Patients: 07/16/2019 Fact Sheet for Healthcare Providers: 07/15/19 This test is not yet approved or cleared by the [place] FDA and  has been authorized for detection and/or diagnosis of SARS-CoV-2 by FDA under an Emergency Use Authorization (EUA). This EUA will remain  in effect (meaning this  test can be used) for the duration of the COVID-19 declaration under Section 56 4(b)(1) of the Act, 21 U.S.C. section  360bbb-3(b)(1), unless the authorization is terminated or revoked sooner. Performed at [facility], [address], [place], [place] [address]   Culture, respiratory     Status: None   Collection Time: 05/15/19  1:22 PM   Specimen: SPU  Result Value Ref Range Status   Specimen Description SPUTUM  Final   Special Requests NONE  Final   Gram Stain   Final    MODERATE WBC PRESENT,BOTH PMN AND MONONUCLEAR MODERATE GRAM POSITIVE COCCI FEW GRAM POSITIVE RODS    Culture   Final    FEW Consistent with normal respiratory flora. Performed at [facility], [address], [place], [place] [address]    Report Status 05/17/2019 FINAL  Final  MRSA PCR Screening     Status: None   Collection Time: 05/16/19 10:40 AM   Specimen: Nasopharyngeal  Result Value Ref Range Status   MRSA by PCR NEGATIVE NEGATIVE Final    Comment:        The GeneXpert MRSA Assay (FDA approved for NASAL specimens only), is one component of a comprehensive MRSA colonization surveillance program. It is not intended to diagnose MRSA infection nor to guide or monitor treatment for MRSA infections. Performed at [facility], [address] [place], [place] [address]      Radiology Studies:  NM Myocar Multi W/Spect W/Wall Motion / EF  Result Date: 05/18/2019 CLINICAL DATA:  59 year old male with a history of cardiomyopathy EXAM: MYOCARDIAL IMAGING WITH SPECT (REST AND PHARMACOLOGIC-STRESS) GATED LEFT VENTRICULAR WALL MOTION STUDY LEFT VENTRICULAR EJECTION FRACTION TECHNIQUE: Standard myocardial SPECT imaging was performed after resting intravenous injection of 10 mCi Tc-6m tetrofosmin. Subsequently, intravenous infusion of Lexiscan was performed under the supervision of the Cardiology staff. At peak effect of the drug, 30 mCi Tc-53m tetrofosmin was injected intravenously and standard  myocardial SPECT imaging was performed. Quantitative gated imaging was also performed to evaluate left ventricular wall motion, and estimate left ventricular ejection fraction. COMPARISON:  None. FINDINGS: Perfusion: No decreased activity in the left ventricle on stress imaging to suggest reversible ischemia or infarction. Wall Motion: Hypokinesia of the septal wall. Left Ventricular Ejection Fraction: 38 % End diastolic volume 147 ml End systolic volume 91 ml IMPRESSION: 1. No reversible ischemia or infarction. 2. Hypokinesia of the septal wall. 3. Left ventricular ejection fraction 38% 4. Non invasive risk stratification*: Intermediate *2012 Appropriate Use Criteria for Coronary Revascularization Focused Update: J Am Coll Cardiol. 2012;59(9):857-881. http://[email or web address]?articleid=[email or web address] Electronically Signed   By: Gilmer Mor D.O.   On: 05/18/2019 13:46     Scheduled Meds:    enoxaparin (LOVENOX) injection  40 mg Subcutaneous Q24H   feeding supplement (ENSURE ENLIVE)  237 mL Oral TID BM   folic acid  1 mg Oral Daily   furosemide       furosemide  20 mg Oral Daily   hydrocortisone cream   Topical BID   losartan  50 mg Oral Daily   metoprolol tartrate  25 mg Oral BID   multivitamin with minerals  1 tablet Oral Daily   thiamine  100 mg Oral Daily   [START ON 05/19/2019] vitamin B-12  1,000 mcg Oral Daily    Continuous Infusions:    piperacillin-tazobactam (ZOSYN)  IV 3.375 g (05/18/19 1355)     LOS: 3 days     Marcellus Scott, MD, [place], [facility]. [facility]    To contact the attending provider between 7A-7P or the covering provider during after hours 7P-7A, please log into  the web site [email or web address] and access using universal [place] password for that web site. If you do not have the password, please call the hospital operator.  05/18/2019, 6:24 PM

## 2019-05-18 NOTE — Progress Notes (Signed)
Progress Note  Patient Name: Allen Saunders Date of Encounter: 05/18/2019  Primary Cardiologist: New to [facility]  Subjective   Denies any chest pain or SOB  Inpatient Medications    Scheduled Meds: . enoxaparin (LOVENOX) injection  40 mg Subcutaneous Q24H  . feeding supplement (ENSURE ENLIVE)  237 mL Oral TID BM  . folic acid  1 mg Oral Daily  . furosemide  20 mg Oral Daily  . hydrocortisone cream   Topical BID  . losartan  50 mg Oral Daily  . metoprolol tartrate  25 mg Oral BID  . multivitamin with minerals  1 tablet Oral Daily  . thiamine  100 mg Oral Daily  . vitamin B-12  1,000 mcg Oral Daily   Continuous Infusions: . piperacillin-tazobactam (ZOSYN)  IV 3.375 g (05/18/19 0253)   PRN Meds: acetaminophen **OR** acetaminophen, ipratropium-albuterol, labetalol, nitroGLYCERIN, ondansetron **OR** ondansetron (ZOFRAN) IV   Vital Signs    Vitals:   05/18/19 0011 05/18/19 0446 05/18/19 0453 05/18/19 0800  BP: 109/79 109/74  121/87  Pulse: 71 73  75  Resp: 19 19  20   Temp: 99.1 F (37.3 C) 98.3 F (36.8 C)  98.2 F (36.8 C)  TempSrc: Oral Oral  Oral  SpO2: 100% 98%  99%  Weight:   53 kg   Height:        Intake/Output Summary (Last 24 hours) at 05/18/2019 0843 Last data filed at 05/18/2019 0300 Gross per 24 hour  Intake 832.94 ml  Output 500 ml  Net 332.94 ml   Filed Weights   05/16/19 0304 05/17/19 0007 05/18/19 0453  Weight: 54 kg 54.3 kg 53 kg    Physical Exam   GEN: Well nourished, well developed in no acute distress HEENT: Normal NECK: No JVD; No carotid bruits LYMPHATICS: No lymphadenopathy CARDIAC:RRR, no murmurs, rubs, gallops RESPIRATORY:  Clear to auscultation without rales, wheezing or rhonchi  ABDOMEN: Soft, non-tender, non-distended MUSCULOSKELETAL:  No edema; No deformity  SKIN: Warm and dry NEUROLOGIC:  Alert and oriented x 3 PSYCHIATRIC:  Normal affect   Labs    Chemistry Recent Labs  Lab 05/15/19 0816 05/15/19 1413 05/16/19 0438  05/17/19 0348 05/18/19 0459  NA 139  --  142 141 139  K 3.4*  --  3.4* 3.1* 3.8  CL 105  --  107 102 103  CO2 18*  --  25 28 26   GLUCOSE 92  --  84 89 84  BUN 6  --  6 9 10   CREATININE 0.72   < > 0.98 1.20 1.32*  CALCIUM 8.9  --  9.1 9.2 9.0  PROT 7.4  --  6.6 7.2  --   ALBUMIN 2.7*  --  2.4* 2.5*  --   AST 19  --  19 16  --   ALT 11  --  10 11  --   ALKPHOS 75  --  58 66  --   BILITOT 0.7  --  1.4* 1.1  --   GFRNONAA >60   < > >60 >60 59*  GFRAA >60   < > >60 >60 >60  ANIONGAP 16*  --  10 11 10    < > = values in this interval not displayed.     Hematology Recent Labs  Lab 05/15/19 1413 05/16/19 0438 05/17/19 0348  WBC 7.4 6.3 7.4  RBC 3.12* 2.79* 3.21*  HGB 10.7* 9.6* 10.8*  HCT 35.3* 30.6* 34.7*  MCV 113.1* 109.7* 108.1*  MCH 34.3* 34.4* 33.6  MCHC 30.3 31.4 31.1  RDW 19.9* 19.4* 18.4*  PLT 228 224 245    Cardiac EnzymesNo results for input(s): TROPONINI in the last 168 hours. No results for input(s): TROPIPOC in the last 168 hours.   BNP Recent Labs  Lab 05/15/19 0816  BNP 2,123.1*     DDimer  Recent Labs  Lab 05/15/19 0816  DDIMER 1.49*     Radiology    ECHOCARDIOGRAM COMPLETE  Result Date: 05/16/2019    ECHOCARDIOGRAM REPORT   Patient Name:   Allen Saunders Date of Exam: 05/16/2019 Medical Rec #:  [id number]  Height:       77.0 in Accession #:    [id number] Weight:       119.0 lb Date of Birth:  01-Jan-1961  BSA:          1.794 m Patient Age:    58 years   BP:           125/94 mmHg Patient Gender: M          HR:           95 bpm. Exam Location:  Inpatient Procedure: 2D Echo, Cardiac Doppler and Color Doppler Indications:    Congestive Heart Failure 428.0  History:        Patient has no prior history of Echocardiogram examinations.                 Risk Factors:Hypertension and Current Smoker. Elevated troponin.  Sonographer:    Renella Cunas RDCS Referring Phys: [id number] Kasandra Knudsen PAHWANI IMPRESSIONS  1. Left ventricular ejection fraction, by estimation, is 35 to  40%. The left ventricle has moderately decreased function. The left ventricle demonstrates global hypokinesis. Indeterminate diastolic filling due to E-A fusion.  2. Right ventricular systolic function is low normal. The right ventricular size is normal. Tricuspid regurgitation signal is inadequate for assessing PA pressure.  3. The mitral valve is normal in structure. Mild mitral valve regurgitation.  4. The aortic valve is tricuspid. Aortic valve regurgitation is mild. No aortic stenosis is present.  5. Aortic dilatation noted. There is moderate dilatation of the aortic root and of the ascending aorta measuring 46 mm.  6. The inferior vena cava is normal in size with greater than 50% respiratory variability, suggesting right atrial pressure of 3 mmHg. Comparison(s): No prior Echocardiogram. FINDINGS  Left Ventricle: Left ventricular ejection fraction, by estimation, is 35 to 40%. The left ventricle has moderately decreased function. The left ventricle demonstrates global hypokinesis. The left ventricular internal cavity size was normal in size. There is no left ventricular hypertrophy. Indeterminate diastolic filling due to E-A fusion. Right Ventricle: The right ventricular size is normal. No increase in right ventricular wall thickness. Right ventricular systolic function is low normal. Tricuspid regurgitation signal is inadequate for assessing PA pressure. Left Atrium: Left atrial size was normal in size. Right Atrium: Right atrial size was normal in size. Pericardium: There is no evidence of pericardial effusion. Mitral Valve: The mitral valve is normal in structure. Mild mitral valve regurgitation, with centrally-directed jet. Tricuspid Valve: The tricuspid valve is normal in structure. Tricuspid valve regurgitation is not demonstrated. Aortic Valve: The aortic valve is tricuspid. Aortic valve regurgitation is mild. No aortic stenosis is present. Pulmonic Valve: The pulmonic valve was grossly normal. Pulmonic  valve regurgitation is not visualized. Aorta: Aortic dilatation noted. There is moderate dilatation of the aortic root and of the ascending aorta measuring 46 mm. Venous: The inferior vena cava is normal in size  with greater than 50% respiratory variability, suggesting right atrial pressure of 3 mmHg. IAS/Shunts: No atrial level shunt detected by color flow Doppler.  LEFT VENTRICLE PLAX 2D LVIDd:         3.80 cm      Diastology LVIDs:         3.30 cm      LV e' lateral:   6.73 cm/s LV PW:         1.10 cm      LV E/e' lateral: 9.0 LV IVS:        1.10 cm      LV e' medial:    6.41 cm/s LVOT diam:     2.50 cm      LV E/e' medial:  9.5 LV SV:         59 LV SV Index:   33 LVOT Area:     4.91 cm  LV Volumes (MOD) LV vol d, MOD A2C: 132.0 ml LV vol d, MOD A4C: 147.0 ml LV vol s, MOD A2C: 83.8 ml LV vol s, MOD A4C: 96.2 ml LV SV MOD A2C:     48.2 ml LV SV MOD A4C:     147.0 ml LV SV MOD BP:      46.4 ml RIGHT VENTRICLE RV S prime:     10.30 cm/s TAPSE (M-mode): 1.7 cm LEFT ATRIUM           Index       RIGHT ATRIUM           Index LA diam:      2.50 cm 1.39 cm/m  RA Area:     12.50 cm LA Vol (A2C): 37.2 ml 20.74 ml/m RA Volume:   26.00 ml  14.49 ml/m LA Vol (A4C): 27.5 ml 15.33 ml/m  AORTIC VALVE LVOT Vmax:   73.10 cm/s LVOT Vmean:  50.400 cm/s LVOT VTI:    0.121 m  AORTA Ao Root diam: 4.60 cm Ao Asc diam:  4.20 cm MITRAL VALVE MV Area (PHT): 6.43 cm    SHUNTS MV Decel Time: 118 msec    Systemic VTI:  0.12 m MR Peak grad: 61.3 mmHg    Systemic Diam: 2.50 cm MR Vmax:      391.50 cm/s MV E velocity: 60.80 cm/s MV A velocity: 58.30 cm/s MV E/A ratio:  1.04 Mihai Croitoru MD Electronically signed by Thurmon Fair MD Signature Date/Time: 05/16/2019/4:28:16 PM    Final    Telemetry    NSR to ST with HR 80-110bpm with PACs- Personally Reviewed  ECG    No new tracing as of 05/16/19- Personally Reviewed  Cardiac Studies   ECHO: 12/10/2016:  Scanned document: LVEF at 55 to 60% with grade 2 DD.  There was evidence of  moderate aortic root dilation at 4.3 cm at that time.  CATH: 10/03/2011:  1. Left main; normal  2. LAD; normal 3. Left circumflex; normal and nondominant.  4. Right coronary artery; normal and dominant 5. Left ventriculography; RAO left ventriculogram was performed using  25 cc of Visipaque dye at 12 cc per second. There were no wall motion abnormalities.the EF was estimated visually at 60%.  IMPRESSION:Mr. Sonu has normal coronary arteries and normal left ventricular function. Believe his chest pain is chest wall from coughing and his EKG or represents early repolarization pattern. Continued medical therapy will be recommended. The sheath was removed and it a TR band is placed on the right wrist chief patent hemostasis. The patient left the  Cath Lab in stable condition. Initially hydrated and discharged home in the morning.  2D echo 05/16/2019 IMPRESSIONS   1. Left ventricular ejection fraction, by estimation, is 35 to 40%. The  left ventricle has moderately decreased function. The left ventricle  demonstrates global hypokinesis. Indeterminate diastolic filling due to  E-A fusion.  2. Right ventricular systolic function is low normal. The right  ventricular size is normal. Tricuspid regurgitation signal is inadequate  for assessing PA pressure.  3. The mitral valve is normal in structure. Mild mitral valve  regurgitation.  4. The aortic valve is tricuspid. Aortic valve regurgitation is mild. No  aortic stenosis is present.  5. Aortic dilatation noted. There is moderate dilatation of the aortic  root and of the ascending aorta measuring 46 mm.  6. The inferior vena cava is normal in size with greater than 50%  respiratory variability, suggesting right atrial pressure of 3 mmHg.   Patient Profile     59 y.o. male  with a hx of hypertension, thoracic aortic aneurysm, aortic root aneurysm, tobacco and alcohol abuse who is being seen today for the evaluation of acute CHF at the  request of Dr Jacqulyn Bath.  Assessment & Plan    1.  Acute systolic CHF: -CXR with increased opacity in the left lung and right lung base compared to previous imaging representing developing PNA -BNP on presentation >2000 with LE edema and orthopnea symptoms consistent with presumed systolic dysfunction -Echo with moderate LV dysfunction with EF 35-40% -HST found to be mildly elevated at 44 with repeat at 55 not necessarily consistent with ACS given significant fluid volume overload and presumed systolic CHF -Started on IV Lasix 40 mg twice daily -I&Os incomplete for yesterday but net neg 6L -weight down 3lbs today -he appears euvolemic on exam today and creatinine continues to trend upward (0.98>1.2>1.3) despite holding Lasix -Continue losartan 50 mg p.o. daily and Lopressor 25mg  BID   2. 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 daily and Lopressor 25mg  BID  3. Sinus tachycardia: -TSH normal  -likely related to underlying LV dysfunction -HR improved after increasing Lopressor to 25mg  BID  3.  Unintentional weight loss: -Pt reports unintentional weight loss over the last year>>185lb down to 116lb  -He states he has had poor oral intake secondary to difficulty swallowing however concerning given CAP PNA treated with outpatient oral antibiotics for 6 weeks without resolution.  CTA performed during this hospitalization with right upper lobe opacity concerning for cavitating pneumonia measuring 5.2 x 3.1 cm with recommendations for follow-up CT scan in 2 to 3 weeks after treatment to ensure resolution  and rule out underlying lung malignancy. -Albumin, 2.7 -TSH found to be 2.063>>WNL  -Management per primary team  4. Hypertensive urgency: -Patient found to be hypertensive on admission -BP much improved -On PTA amlodipine 10mg  PO QD -Amlodipine stopped>losartan 50mg  PO QD and Lopressor 25mg  BID  initiated    5.  Tobacco and alcohol abuse: -Smoking cessation strongly encouraged -Reports 8-12 beers per day -CIWA protocol per primary team  6.  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  > 50% of time was spent in direct patient care.    Signed, Fransico Him, MD [facility] Pager: [phone] 05/18/2019, 8:43 AM     For questions or updates, please contact   Please consult [email or web address] for contact info under Cardiology/STEMI.

## 2019-05-18 NOTE — Progress Notes (Signed)
   Allen Saunders presented for a nuclear stress test today.  No immediate complications.  Stress imaging is pending at this time.  Preliminary EKG findings may be listed in the chart, but the stress test result will not be finalized until perfusion imaging is complete.  1 day study, [facility] to read.  Theodore Demark, PA-C 05/18/2019, 11:14 AM

## 2019-05-19 ENCOUNTER — Telehealth: Payer: Self-pay | Admitting: Medical

## 2019-05-19 LAB — BASIC METABOLIC PANEL
Anion gap: 12 (ref 5–15)
BUN: 12 mg/dL (ref 6–20)
CO2: 25 mmol/L (ref 22–32)
Calcium: 9 mg/dL (ref 8.9–10.3)
Chloride: 103 mmol/L (ref 98–111)
Creatinine, Ser: 1.27 mg/dL — ABNORMAL HIGH (ref 0.61–1.24)
GFR calc Af Amer: >60 mL/min (ref >60)
GFR calc non Af Amer: >60 mL/min (ref >60)
Glucose, Bld: 87 mg/dL (ref 70–99)
Potassium: 3.6 mmol/L (ref 3.5–5.1)
Sodium: 140 mmol/L (ref 135–145)

## 2019-05-19 MED ORDER — CARVEDILOL 6.25 MG PO TABS
6.2500 mg | ORAL_TABLET | Freq: Two times a day (BID) | ORAL | Status: DC
  Administered 2019-05-19 – 2019-05-20 (×3): 6.25 mg via ORAL
  Filled 2019-05-19 (×3): qty 1

## 2019-05-19 NOTE — Progress Notes (Signed)
Progress Note  Patient Name: Allen Saunders Date of Encounter: 05/19/2019  Primary Cardiologist: New to [facility]  Subjective   Denies any chest pain or SOB  Inpatient Medications    Scheduled Meds: . enoxaparin (LOVENOX) injection  40 mg Subcutaneous Q24H  . feeding supplement (ENSURE ENLIVE)  237 mL Oral TID BM  . folic acid  1 mg Oral Daily  . furosemide  20 mg Oral Daily  . hydrocortisone cream   Topical BID  . losartan  50 mg Oral Daily  . metoprolol tartrate  25 mg Oral BID  . multivitamin with minerals  1 tablet Oral Daily  . thiamine  100 mg Oral Daily  . vitamin B-12  1,000 mcg Oral Daily   Continuous Infusions: . piperacillin-tazobactam (ZOSYN)  IV 3.375 g (05/19/19 0444)   PRN Meds: acetaminophen **OR** acetaminophen, ipratropium-albuterol, labetalol, nitroGLYCERIN, ondansetron **OR** ondansetron (ZOFRAN) IV   Vital Signs    Vitals:   05/18/19 1932 05/18/19 1940 05/19/19 0021 05/19/19 0411  BP:  111/77 129/87 133/84  Pulse:  71 77 71  Resp:  20 17 16   Temp:  97.9 F (36.6 C) 98.6 F (37 C) 98.4 F (36.9 C)  TempSrc:  Oral Oral Oral  SpO2: 100% 100% 100% 99%  Weight:   53.9 kg   Height:        Intake/Output Summary (Last 24 hours) at 05/19/2019 0707 Last data filed at 05/19/2019 0415 Gross per 24 hour  Intake 562.41 ml  Output 800 ml  Net -237.59 ml   Filed Weights   05/17/19 0007 05/18/19 0453 05/19/19 0021  Weight: 54.3 kg 53 kg 53.9 kg    Physical Exam   GEN: Well nourished, well developed in no acute distress HEENT: Normal NECK: No JVD; No carotid bruits LYMPHATICS: No lymphadenopathy CARDIAC:RRR, no murmurs, rubs, gallops RESPIRATORY:  Clear to auscultation without rales, wheezing or rhonchi  ABDOMEN: Soft, non-tender, non-distended MUSCULOSKELETAL:  No edema; No deformity  SKIN: Warm and dry NEUROLOGIC:  Alert and oriented x 3 PSYCHIATRIC:  Normal affect    Labs    Chemistry Recent Labs  Lab 05/15/19 0816 05/15/19 1413  05/16/19 0438 05/16/19 0438 05/17/19 0348 05/18/19 0459 05/19/19 0446  NA 139  --  142   < > 141 139 140  K 3.4*  --  3.4*   < > 3.1* 3.8 3.6  CL 105  --  107   < > 102 103 103  CO2 18*  --  25   < > 28 26 25   GLUCOSE 92  --  84   < > 89 84 87  BUN 6  --  6   < > 9 10 12   CREATININE 0.72   < > 0.98   < > 1.20 1.32* 1.27*  CALCIUM 8.9  --  9.1   < > 9.2 9.0 9.0  PROT 7.4  --  6.6  --  7.2  --   --   ALBUMIN 2.7*  --  2.4*  --  2.5*  --   --   AST 19  --  19  --  16  --   --   ALT 11  --  10  --  11  --   --   ALKPHOS 75  --  58  --  66  --   --   BILITOT 0.7  --  1.4*  --  1.1  --   --   GFRNONAA >60   < > >  60   < > >60 59* >60  GFRAA >60   < > >60   < > >60 >60 >60  ANIONGAP 16*  --  10   < > 11 10 12    < > = values in this interval not displayed.     Hematology Recent Labs  Lab 05/15/19 1413 05/16/19 0438 05/17/19 0348  WBC 7.4 6.3 7.4  RBC 3.12* 2.79* 3.21*  HGB 10.7* 9.6* 10.8*  HCT 35.3* 30.6* 34.7*  MCV 113.1* 109.7* 108.1*  MCH 34.3* 34.4* 33.6  MCHC 30.3 31.4 31.1  RDW 19.9* 19.4* 18.4*  PLT 228 224 245    Cardiac EnzymesNo results for input(s): TROPONINI in the last 168 hours. No results for input(s): TROPIPOC in the last 168 hours.   BNP Recent Labs  Lab 05/15/19 0816  BNP 2,123.1*     DDimer  Recent Labs  Lab 05/15/19 0816  DDIMER 1.49*     Radiology    NM Myocar Multi W/Spect W/Wall Motion / EF  Result Date: 05/18/2019 CLINICAL DATA:  59 year old male with a history of cardiomyopathy EXAM: MYOCARDIAL IMAGING WITH SPECT (REST AND PHARMACOLOGIC-STRESS) GATED LEFT VENTRICULAR WALL MOTION STUDY LEFT VENTRICULAR EJECTION FRACTION TECHNIQUE: Standard myocardial SPECT imaging was performed after resting intravenous injection of 10 mCi Tc-48m tetrofosmin. Subsequently, intravenous infusion of Lexiscan was performed under the supervision of the Cardiology staff. At peak effect of the drug, 30 mCi Tc-54m tetrofosmin was injected intravenously and  standard myocardial SPECT imaging was performed. Quantitative gated imaging was also performed to evaluate left ventricular wall motion, and estimate left ventricular ejection fraction. COMPARISON:  None. FINDINGS: Perfusion: No decreased activity in the left ventricle on stress imaging to suggest reversible ischemia or infarction. Wall Motion: Hypokinesia of the septal wall. Left Ventricular Ejection Fraction: 38 % End diastolic volume 161 ml End systolic volume 91 ml IMPRESSION: 1. No reversible ischemia or infarction. 2. Hypokinesia of the septal wall. 3. Left ventricular ejection fraction 38% 4. Non invasive risk stratification*: Intermediate *2012 Appropriate Use Criteria for Coronary Revascularization Focused Update: J Am Coll Cardiol. 0960;45(4):098-119. http://content.airportbarriers.com.aspx?articleid=1201161 Electronically Signed   By: Corrie Mckusick D.O.   On: 05/18/2019 13:46   Telemetry    NSR with PACs- Personally Reviewed  ECG    No new tracing as of 05/16/19- Personally Reviewed  Cardiac Studies   ECHO: 12/10/2016:  Scanned document: LVEF at 55 to 60% with grade 2 DD.  There was evidence of moderate aortic root dilation at 4.3 cm at that time.  CATH: 10/03/2011:  1. Left main; normal  2. LAD; normal 3. Left circumflex; normal and nondominant.  4. Right coronary artery; normal and dominant 5. Left ventriculography; RAO left ventriculogram was performed using  25 cc of Visipaque dye at 12 cc per second. There were no wall motion abnormalities.the EF was estimated visually at 60%.  IMPRESSION:Mr. Sou has normal coronary arteries and normal left ventricular function. Believe his chest pain is chest wall from coughing and his EKG or represents early repolarization pattern. Continued medical therapy will be recommended. The sheath was removed and it a TR band is placed on the right wrist chief patent hemostasis. The patient left the Cath Lab in stable condition. Initially  hydrated and discharged home in the morning.  2D echo 05/16/2019 IMPRESSIONS   1. Left ventricular ejection fraction, by estimation, is 35 to 40%. The  left ventricle has moderately decreased function. The left ventricle  demonstrates global hypokinesis. Indeterminate diastolic filling due to  E-A fusion.  2. Right ventricular systolic function is low normal. The right  ventricular size is normal. Tricuspid regurgitation signal is inadequate  for assessing PA pressure.  3. The mitral valve is normal in structure. Mild mitral valve  regurgitation.  4. The aortic valve is tricuspid. Aortic valve regurgitation is mild. No  aortic stenosis is present.  5. Aortic dilatation noted. There is moderate dilatation of the aortic  root and of the ascending aorta measuring 46 mm.  6. The inferior vena cava is normal in size with greater than 50%  respiratory variability, suggesting right atrial pressure of 3 mmHg.   Nuclear stress test 05/18/2019 IMPRESSION: 1. No reversible ischemia or infarction.  2. Hypokinesia of the septal wall.  3. Left ventricular ejection fraction 38%  4. Non invasive risk stratification*: Intermediate  Patient Profile     59 y.o. male  with a hx of hypertension, thoracic aortic aneurysm, aortic root aneurysm, tobacco and alcohol abuse who is being seen today for the evaluation of acute CHF at the request of Dr Jacqulyn Bath.  Assessment & Plan    1.  Acute systolic CHF: -CXR with increased opacity in the left lung and right lung base compared to previous imaging representing developing PNA -BNP on presentation >2000 with LE edema and orthopnea symptoms consistent with presumed systolic dysfunction -Echo with moderate LV dysfunction with EF 35-40% -hsTrop found to be mildly elevated at 44 with repeat at 55 not necessarily consistent with ACS given significant fluid volume overload and presumed systolic CHF -Started on IV Lasix 40 mg twice daily -he put out 800cc  yesterday and is net neg 6.2L since admit -weight up 2lbs today -he appears euvolemic on exam today  -creatinine has improved  (0.98>1.2>1.3>1.27) -Continue losartan 50 mg p.o. daily and Lasix 20mg  daily -change lopressor to Carvedilol 6.25mg  BID  2. 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 daily and Lopressor 25mg  BID  3. Sinus tachycardia: -TSH normal  -likely related to underlying LV dysfunction -HR improved after increasing Lopressor to 25mg  BID  3.  Unintentional weight loss: -Pt reports unintentional weight loss over the last year>>185lb down to 116lb  -He states he has had poor oral intake secondary to difficulty swallowing however concerning given CAP PNA treated with outpatient oral antibiotics for 6 weeks without resolution.  CTA performed during this hospitalization with right upper lobe opacity concerning for cavitating pneumonia measuring 5.2 x 3.1 cm with recommendations for follow-up CT scan in 2 to 3 weeks after treatment to ensure resolution and rule out underlying lung malignancy. -Albumin, 2.7 -TSH found to be 2.063>>WNL  -Management per primary team  4. Hypertensive urgency: -Patient found to be hypertensive on admission -BP much improved at 133/54mmHg this am -On PTA amlodipine 10mg  PO QD -Amlodipine stopped>losartan 50mg  PO QD and Lopressor 25mg  BID  initiated   -change Lopressor to Carvedilol 6.25mg  BID  5.  Tobacco and alcohol abuse: -Smoking cessation strongly encouraged -Reports 8-12 beers per day -CIWA protocol per primary team  6.  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  > 50% of time was spent in direct patient care.    Signed, Armanda Magic, MD [facility] Pager: [phone] 05/19/2019, 7:07 AM     For questions or updates, please contact   Please consult [email or web address] for contact info under Cardiology/STEMI.

## 2019-05-19 NOTE — Progress Notes (Signed)
PROGRESS NOTE   Allen Saunders  JJO:[id number]    DOB: 1960-07-12    DOA: 05/15/2019  PCP: [name], MD   I have briefly reviewed patients previous medical records in [facility].  Chief Complaint:   Chief Complaint  Patient presents with  . Shortness of Breath    Brief Narrative:  59 year old married male, moves around with the help of a cane/walker and a scooter at home, PMH of hypertension, thoracic aortic aneurysm, tobacco and alcohol abuse presented with progressive dyspnea, associated bilateral leg swelling, orthopnea, cough productive of pinkish-red sputum.  He also reported chills, intermittent chest pain, generalized weakness, lethargy and night sweats.  He was evaluated by his PCP at the [place] and he was also referred to oncology at the [facility] but eventually cleared per patient.   Assessment & Plan:  Principal Problem:   Acute hypoxemic respiratory failure (HCC) Active Problems:   Hypertension   Thoracic aortic aneurysm, without rupture (HCC)   Hypokalemia   Elevated troponin   Lactic acidosis   Macrocytic anemia   Fluid overload   Cavitary pneumonia   Tobacco abuse   Alcohol abuse   Leg edema   Weight loss   Malnutrition (HCC)   Peripheral edema   Sinus tachycardia   Protein-calorie malnutrition, severe   Acute hypoxic respiratory failure due to right upper lobe cavitary pneumonia and new onset acute systolic CHF/sepsis due to pneumonia, POA: Initially started on vancomycin and Zosyn for cavitary pneumonia seen on CT.  However for better anaerobic coverage, cefepime was switched to Zosyn.  MRSA PCR negative and hence discontinued vancomycin today.  TTE: LVEF 35-40% with global hypokinesis.  Treated with IV Lasix 40 mg twice daily.  -5.9 L thus far.  Cardiology input appreciated, discussed with Dr. Mayford Knife 4/8.  Appears euvolemic and hence transitioned to oral Lasix 20 mg 4/8.  Increased Lopressor to 25 mg twice daily.  Continue losartan 50 mg daily.  Coronary CTA  was initially planned but since unable to get heart rate adequately low.  Hypoxia has resolved.  Bilateral lower extremity venous Dopplers negative for DVT.  Since patient presented with weight loss, chills, weakness, TB QuantiFERON gold requested and pending although low index of suspicion.  Patient reportedly was in the [employer] and tested often for TB.   Nuclear stress test: No reversible ischemia or infarction. Hypokinesis of the septal wall. LVEF 38%. Currently on low-dose oral Lasix and ARB, creatinine has slightly gone up from 1.2-1.32, improved to 1.27, monitor BMP in a.m.Marland Kitchen Patient had a lot of questions for the cardiologist.  Had cardiology PA speak with patient.  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  Have changed Lopressor to carvedilol and exploring cost options regarding Entresto.  Cardiology has cleared patient for discharge and have arranged outpatient follow-up.   Hypokalemia: Replaced.  Magnesium normal.  Weight loss: Concerning.  He does have history of alcohol abuse but reportedly quit 2 months PTA.  He reports poor oral intake over the last several months due to poor dentition.  He apparently was evaluated by oncology at the [facility] and per his report he is now cleared.  He will need to be treated for cavitary pneumonia and will need repeat CTA in several weeks to reassess and rule out underlying malignancy.  Outpatient follow-up  Macrocytic anemia/B12 deficiency: Stable.  May be related to prior alcohol abuse and B12 deficiency.  Continue B12 supplements IM and then orally.  Elevated troponin: No anginal symptoms.  Less likely due to ACS.  Likely due to demand ischemia from hypoxia, pneumonia and acute CHF.  Cardiology on board and abnormal stress test noted, no further work-up planned.  Essential hypertension: Controlled and even soft blood pressures at time.  Amlodipine now discontinued.  Metoprolol changed to carvedilol, continue  losartan.  Tobacco abuse: Nicotine patch declined by patient.  Cessation counseled.  Alcohol abuse: Repeatedly states that he has quit 2 months ago and appears frustrated to discuss.  No withdrawal features noted.  Body mass index is 14.1 kg/m.  Nutritional Status Nutrition Problem: Severe Malnutrition Etiology: chronic illness(CHF, ETOH abuse) Signs/Symptoms: severe fat depletion, moderate muscle depletion, severe muscle depletion Interventions: Ensure Enlive (each supplement provides 350kcal and 20 grams of protein), Magic cup, MVI  DVT prophylaxis: Lovenox Code Status: Full Family Communication: None at bedside today. Disposition:  . Patient came from: Home           . Anticipated d/c place: Home . Barriers to d/c: Pending further clinical optimization, additional day of IV antibiotics, hopeful discharge home 4/10.   Consultants:   Cardiology  Procedures:   None  Antimicrobials:   IV cefepime x3 doses on 4/5 IV Zosyn 4/6 > IV vancomycin 4/5-4/6   Subjective:  Patient frustrated because he does not seem to be getting adequate information from cardiology.  No chest pain or dyspnea.  No cough.  Objective:   Vitals:   05/19/19 0021 05/19/19 0411 05/19/19 0743 05/19/19 1123  BP: 129/87 133/84 130/85 120/76  Pulse: 77 71 73 76  Resp: 17 16 18 18   Temp: 98.6 F (37 C) 98.4 F (36.9 C) 98.4 F (36.9 C) 98.1 F (36.7 C)  TempSrc: Oral Oral Oral Oral  SpO2: 100% 99% 99% 100%  Weight: 53.9 kg     Height:        General exam: Pleasant young male, moderately built and nourished lying comfortably propped up in bed without distress. Respiratory system: Clear to auscultation.  No increased work of breathing. Cardiovascular system: S1 and S2 heard, RRR. No JVD, murmurs or pedal edema.  Telemetry personally reviewed: Sinus rhythm. Gastrointestinal system: Abdomen is nondistended, soft and nontender. No organomegaly or masses felt. Normal bowel sounds heard. Central  nervous system: Alert and oriented. No focal neurological deficits. Extremities: Symmetric 5 x 5 power. Skin: No rashes, lesions or ulcers Psychiatry: Judgement and insight appear normal. Mood & affect appropriate.     Data Reviewed:   I have personally reviewed following labs and imaging studies   CBC: Recent Labs  Lab 05/15/19 0816 05/15/19 1229 05/15/19 1413 05/16/19 0438 05/17/19 0348  WBC 7.4  --  7.4 6.3 7.4  NEUTROABS 5.2  --   --   --   --   HGB 10.3*  --  10.7* 9.6* 10.8*  HCT 34.2*   < > 35.3* 30.6* 34.7*  MCV 114.0*  --  113.1* 109.7* 108.1*  PLT 220  --  228 224 245   < > = values in this interval not displayed.    Basic Metabolic Panel: Recent Labs  Lab 05/15/19 1413 05/16/19 0438 05/17/19 0348 05/18/19 0459 05/19/19 0446  NA  --    < > 141 139 140  K  --    < > 3.1* 3.8 3.6  CL  --    < > 102 103 103  CO2  --    < > 28 26 25   GLUCOSE  --    < > 89 84 87  BUN  --    < > 9 10 12   CREATININE 0.76   < > 1.20 1.32* 1.27*  CALCIUM  --    < > 9.2 9.0 9.0  MG 1.8  --   --   --   --   PHOS 3.7  --   --   --   --    < > = values in this interval not displayed.    Liver Function Tests: Recent Labs  Lab 05/15/19 0816 05/16/19 0438 05/17/19 0348  AST 19 19 16   ALT 11 10 11   ALKPHOS 75 58 66  BILITOT 0.7 1.4* 1.1  PROT 7.4 6.6 7.2  ALBUMIN 2.7* 2.4* 2.5*    CBG: No results for input(s): GLUCAP in the last 168 hours.  Microbiology Studies:   Recent Results (from the past 240 hour(s))  Blood culture (routine x 2)     Status: None (Preliminary result)   Collection Time: 05/15/19  8:13 AM   Specimen: BLOOD  Result Value Ref Range Status   Specimen Description BLOOD RIGHT ANTECUBITAL  Final   Special Requests   Final    BOTTLES DRAWN AEROBIC AND ANAEROBIC Blood Culture results may not be optimal due to an inadequate volume of blood received in culture bottles   Culture   Final    NO GROWTH 4 DAYS Performed at [facility], [place]  [address], [place], [place] [address]    Report Status PENDING  Incomplete  Blood culture (routine x 2)     Status: None (Preliminary result)   Collection Time: 05/15/19  8:18 AM   Specimen: BLOOD  Result Value Ref Range Status   Specimen Description BLOOD SITE NOT SPECIFIED  Final   Special Requests   Final    BOTTLES DRAWN AEROBIC AND ANAEROBIC Blood Culture adequate volume   Culture   Final    NO GROWTH 4 DAYS Performed at [facility], [address] [place], [place] [address]    Report Status PENDING  Incomplete  Urine culture     Status: None   Collection Time: 05/15/19  8:56 AM   Specimen: Urine, Random  Result Value Ref Range Status   Specimen Description URINE, RANDOM  Final   Special Requests NONE  Final   Culture   Final    NO GROWTH Performed at [facility], [place] [address], [place], [place] [address]    Report Status 05/16/2019 FINAL  Final  SARS CORONAVIRUS 2 (TAT 6-24 HRS) Nasopharyngeal Nasopharyngeal Swab     Status: None   Collection Time: 05/15/19  1:22 PM   Specimen: Nasopharyngeal Swab  Result Value Ref Range Status   SARS Coronavirus 2 NEGATIVE NEGATIVE Final    Comment: (NOTE) SARS-CoV-2 target nucleic acids are NOT DETECTED. The SARS-CoV-2 RNA is generally detectable in upper and lower respiratory specimens during the acute phase of infection. Negative results do not preclude SARS-CoV-2 infection, do not rule out co-infections with other pathogens, and should not be used as the sole basis for treatment or other patient management decisions. Negative results must be combined with clinical observations, patient history, and epidemiological information. The expected result is Negative. Fact Sheet for Patients: SugarRoll.be Fact Sheet for Healthcare Providers: https://www.woods-mathews.com/ This test is not yet approved or cleared by the [place] FDA and  has been authorized for detection and/or  diagnosis of SARS-CoV-2 by FDA under an Emergency Use Authorization (EUA). This EUA will remain  in effect (meaning  this test can be used) for the duration of the COVID-19 declaration under Section 56 4(b)(1) of the Act, 21 U.S.C. section 360bbb-3(b)(1), unless the authorization is terminated or revoked sooner. Performed at [facility], [address] [place], [place] [address]   Culture, respiratory     Status: None   Collection Time: 05/15/19  1:22 PM   Specimen: SPU  Result Value Ref Range Status   Specimen Description SPUTUM  Final   Special Requests NONE  Final   Gram Stain   Final    MODERATE WBC PRESENT,BOTH PMN AND MONONUCLEAR MODERATE GRAM POSITIVE COCCI FEW GRAM POSITIVE RODS    Culture   Final    FEW Consistent with normal respiratory flora. Performed at [facility], [address], [place], [place] [address]    Report Status 05/17/2019 FINAL  Final  MRSA PCR Screening     Status: None   Collection Time: 05/16/19 10:40 AM   Specimen: Nasopharyngeal  Result Value Ref Range Status   MRSA by PCR NEGATIVE NEGATIVE Final    Comment:        The GeneXpert MRSA Assay (FDA approved for NASAL specimens only), is one component of a comprehensive MRSA colonization surveillance program. It is not intended to diagnose MRSA infection nor to guide or monitor treatment for MRSA infections. Performed at [facility], [address] [place], [place] [address]      Radiology Studies:  No results found.   Scheduled Meds:   . carvedilol  6.25 mg Oral BID WC  . enoxaparin (LOVENOX) injection  40 mg Subcutaneous Q24H  . feeding supplement (ENSURE ENLIVE)  237 mL Oral TID BM  . folic acid  1 mg Oral Daily  . furosemide  20 mg Oral Daily  . hydrocortisone cream   Topical BID  . losartan  50 mg Oral Daily  . multivitamin with minerals  1 tablet Oral Daily  . thiamine  100 mg Oral Daily  . vitamin B-12  1,000 mcg Oral Daily    Continuous Infusions:    . piperacillin-tazobactam (ZOSYN)  IV 3.375 g (05/19/19 1401)     LOS: 4 days     Marcellus Scott, MD, [place], [facility]. [facility]    To contact the attending provider between 7A-7P or the covering provider during after hours 7P-7A, please log into the web site [email or web address] and access using universal [place] password for that web site. If you do not have the password, please call the hospital operator.  05/19/2019, 6:25 PM

## 2019-05-19 NOTE — TOC Progression Note (Addendum)
Transition of Care [facility]) - Progression Note    Patient Details  Name: Rik Wadel MRN: [id number] Date of Birth: 05-10-1960  [facility] CM/SW Contact  Leone Haven, RN Phone Number: 05/19/2019, 5:34 PM  Clinical Narrative:    [facility] also, he will need the 30 day free entresto and then he will have to get the refill from the [place].  NCM gave patient the 30 day free coupon for entresto.  His PCP is the male Dr. Allena Katz at the [facility] [phone].  Will need to fax dc summary.        Expected Discharge Plan and Services                                                 Social Determinants of Health (SDOH) Interventions    Readmission Risk Interventions No flowsheet data found.

## 2019-05-19 NOTE — Telephone Encounter (Signed)
   Patient is scheduled to see Ronie Spies, PA-C 05/31/19 at 3:15pm. Will route to [facility] pool to place a TOC call.   Beatriz Stallion, PA-C 05/19/19; 3:21 PM

## 2019-05-19 NOTE — Care Management (Signed)
Per [place] [facility] help desk : Co-pay amount for Entresto 36m -275mand or [id number]  $585.28 for a 30 day supply bid..  Marland KitchenA required '@[phone]' -[phone]  not met remaining bal:$ [phone] Tier :3 Retail pharmacy : [facility],[place] [facility]. Mail order : [facility].  RefIFO#[id number]

## 2019-05-20 ENCOUNTER — Other Ambulatory Visit: Payer: Self-pay | Admitting: Student

## 2019-05-20 DIAGNOSIS — Z79899 Other long term (current) drug therapy: Secondary | ICD-10-CM

## 2019-05-20 DIAGNOSIS — E876 Hypokalemia: Secondary | ICD-10-CM

## 2019-05-20 LAB — BASIC METABOLIC PANEL
Anion gap: 11 (ref 5–15)
BUN: 17 mg/dL (ref 6–20)
CO2: 27 mmol/L (ref 22–32)
Calcium: 9 mg/dL (ref 8.9–10.3)
Chloride: 102 mmol/L (ref 98–111)
Creatinine, Ser: 1.42 mg/dL — ABNORMAL HIGH (ref 0.61–1.24)
GFR calc Af Amer: >60 mL/min (ref >60)
GFR calc non Af Amer: 54 mL/min — ABNORMAL LOW (ref >60)
Glucose, Bld: 82 mg/dL (ref 70–99)
Potassium: 3.5 mmol/L (ref 3.5–5.1)
Sodium: 140 mmol/L (ref 135–145)

## 2019-05-20 LAB — CULTURE, BLOOD (ROUTINE X 2)
Culture: NO GROWTH
Culture: NO GROWTH
Special Requests: ADEQUATE

## 2019-05-20 MED ORDER — FUROSEMIDE 20 MG PO TABS: 20.0000 mg | ORAL_TABLET | Freq: Every day | ORAL | 0 refills | Status: AC

## 2019-05-20 MED ORDER — CARVEDILOL 6.25 MG PO TABS: 6.2500 mg | ORAL_TABLET | Freq: Two times a day (BID) | ORAL | 0 refills | Status: AC

## 2019-05-20 MED ORDER — SACUBITRIL-VALSARTAN 24-26 MG PO TABS: 1.0000 | ORAL_TABLET | Freq: Two times a day (BID) | ORAL | 0 refills | Status: AC

## 2019-05-20 MED ORDER — ADULT MULTIVITAMIN W/MINERALS CH: 1.0000 | ORAL_TABLET | Freq: Every day | ORAL | Status: AC

## 2019-05-20 MED ORDER — ENSURE ENLIVE PO LIQD: 237.0000 mL | Freq: Three times a day (TID) | ORAL | 12 refills | Status: AC

## 2019-05-20 MED ORDER — AMOXICILLIN-POT CLAVULANATE 875-125 MG PO TABS: 1.0000 | ORAL_TABLET | Freq: Two times a day (BID) | ORAL | 0 refills | Status: AC

## 2019-05-20 MED ORDER — ATORVASTATIN CALCIUM 20 MG PO TABS: 20.0000 mg | ORAL_TABLET | Freq: Every day | ORAL | 0 refills | Status: AC

## 2019-05-20 MED ORDER — SACUBITRIL-VALSARTAN 24-26 MG PO TABS
1.0000 | ORAL_TABLET | Freq: Two times a day (BID) | ORAL | Status: DC
  Administered 2019-05-20: 1 via ORAL
  Filled 2019-05-20: qty 1

## 2019-05-20 MED ORDER — FOLIC ACID 1 MG PO TABS: 1.0000 mg | ORAL_TABLET | Freq: Every day | ORAL | 0 refills | Status: AC

## 2019-05-20 MED ORDER — CYANOCOBALAMIN 1000 MCG PO TABS: 1000.0000 ug | ORAL_TABLET | Freq: Every day | ORAL | 0 refills | Status: AC

## 2019-05-20 MED ORDER — SODIUM CHLORIDE 0.9 % IV SOLN
INTRAVENOUS | Status: DC | PRN
  Administered 2019-05-20: 250 mL via INTRAVENOUS

## 2019-05-20 NOTE — Progress Notes (Addendum)
Progress Note  Patient Name: Allen Saunders Date of Encounter: 05/20/2019  Primary Cardiologist: New to [facility]  Subjective   Denies any chest pain or SOB.  Anxious to go home  Inpatient Medications    Scheduled Meds: . carvedilol  6.25 mg Oral BID WC  . enoxaparin (LOVENOX) injection  40 mg Subcutaneous Q24H  . feeding supplement (ENSURE ENLIVE)  237 mL Oral TID BM  . folic acid  1 mg Oral Daily  . furosemide  20 mg Oral Daily  . hydrocortisone cream   Topical BID  . losartan  50 mg Oral Daily  . multivitamin with minerals  1 tablet Oral Daily  . thiamine  100 mg Oral Daily  . vitamin B-12  1,000 mcg Oral Daily   Continuous Infusions: . piperacillin-tazobactam (ZOSYN)  IV 3.375 g (05/20/19 0557)   PRN Meds: acetaminophen **OR** acetaminophen, ipratropium-albuterol, labetalol, nitroGLYCERIN, ondansetron **OR** ondansetron (ZOFRAN) IV   Vital Signs    Vitals:   05/19/19 0743 05/19/19 1123 05/20/19 0100 05/20/19 0501  BP: 130/85 120/76  129/80  Pulse: 73 76  75  Resp: 18 18  20   Temp: 98.4 F (36.9 C) 98.1 F (36.7 C)  98.2 F (36.8 C)  TempSrc: Oral Oral  Oral  SpO2: 99% 100%  99%  Weight:   55.1 kg   Height:        Intake/Output Summary (Last 24 hours) at 05/20/2019 0808 Last data filed at 05/20/2019 0100 Gross per 24 hour  Intake 480 ml  Output 1300 ml  Net -820 ml   Filed Weights   05/18/19 0453 05/19/19 0021 05/20/19 0100  Weight: 53 kg 53.9 kg 55.1 kg    Physical Exam   GEN: Well nourished, well developed in no acute distress HEENT: Normal NECK: No JVD; No carotid bruits LYMPHATICS: No lymphadenopathy CARDIAC:RRR, no murmurs, rubs, gallops RESPIRATORY:  Clear to auscultation without rales, wheezing or rhonchi  ABDOMEN: Soft, non-tender, non-distended MUSCULOSKELETAL:  No edema; No deformity  SKIN: Warm and dry NEUROLOGIC:  Alert and oriented x 3 PSYCHIATRIC:  Normal affect   Labs    Chemistry Recent Labs  Lab 05/15/19 0816  05/15/19 1413 05/16/19 0438 05/16/19 07/16/19 05/17/19 0348 05/17/19 0348 05/18/19 0459 05/19/19 0446 05/20/19 0435  NA 139  --  142   < > 141   < > 139 140 140  K 3.4*  --  3.4*   < > 3.1*   < > 3.8 3.6 3.5  CL 105  --  107   < > 102   < > 103 103 102  CO2 18*  --  25   < > 28   < > 26 25 27   GLUCOSE 92  --  84   < > 89   < > 84 87 82  BUN 6  --  6   < > 9   < > 10 12 17   CREATININE 0.72   < > 0.98   < > 1.20   < > 1.32* 1.27* 1.42*  CALCIUM 8.9  --  9.1   < > 9.2   < > 9.0 9.0 9.0  PROT 7.4  --  6.6  --  7.2  --   --   --   --   ALBUMIN 2.7*  --  2.4*  --  2.5*  --   --   --   --   AST 19  --  19  --  16  --   --   --   --  ALT 11  --  10  --  11  --   --   --   --   ALKPHOS 75  --  58  --  66  --   --   --   --   BILITOT 0.7  --  1.4*  --  1.1  --   --   --   --   GFRNONAA >60   < > >60   < > >60   < > 59* >60 54*  GFRAA >60   < > >60   < > >60   < > >60 >60 >60  ANIONGAP 16*  --  10   < > 11   < > 10 12 11    < > = values in this interval not displayed.     Hematology Recent Labs  Lab 05/15/19 1413 05/16/19 0438 05/17/19 0348  WBC 7.4 6.3 7.4  RBC 3.12* 2.79* 3.21*  HGB 10.7* 9.6* 10.8*  HCT 35.3* 30.6* 34.7*  MCV 113.1* 109.7* 108.1*  MCH 34.3* 34.4* 33.6  MCHC 30.3 31.4 31.1  RDW 19.9* 19.4* 18.4*  PLT 228 224 245    Cardiac EnzymesNo results for input(s): TROPONINI in the last 168 hours. No results for input(s): TROPIPOC in the last 168 hours.   BNP Recent Labs  Lab 05/15/19 0816  BNP 2,123.1*     DDimer  Recent Labs  Lab 05/15/19 0816  DDIMER 1.49*     Radiology    NM Myocar Multi W/Spect W/Wall Motion / EF  Result Date: 05/18/2019 CLINICAL DATA:  59 year old male with a history of cardiomyopathy EXAM: MYOCARDIAL IMAGING WITH SPECT (REST AND PHARMACOLOGIC-STRESS) GATED LEFT VENTRICULAR WALL MOTION STUDY LEFT VENTRICULAR EJECTION FRACTION TECHNIQUE: Standard myocardial SPECT imaging was performed after resting intravenous injection of 10 mCi Tc-84m  tetrofosmin. Subsequently, intravenous infusion of Lexiscan was performed under the supervision of the Cardiology staff. At peak effect of the drug, 30 mCi Tc-23m tetrofosmin was injected intravenously and standard myocardial SPECT imaging was performed. Quantitative gated imaging was also performed to evaluate left ventricular wall motion, and estimate left ventricular ejection fraction. COMPARISON:  None. FINDINGS: Perfusion: No decreased activity in the left ventricle on stress imaging to suggest reversible ischemia or infarction. Wall Motion: Hypokinesia of the septal wall. Left Ventricular Ejection Fraction: 38 % End diastolic volume 397 ml End systolic volume 91 ml IMPRESSION: 1. No reversible ischemia or infarction. 2. Hypokinesia of the septal wall. 3. Left ventricular ejection fraction 38% 4. Non invasive risk stratification*: Intermediate *2012 Appropriate Use Criteria for Coronary Revascularization Focused Update: J Am Coll Cardiol. 6734;19(3):790-240. http://[email or web address]?[email or web address] Electronically Signed   By: Corrie Mckusick D.O.   On: 05/18/2019 13:46   Telemetry    NSR - Personally Reviewed  ECG    No new EKG- Personally Reviewed  Cardiac Studies   ECHO: 12/10/2016:  Scanned document: LVEF at 55 to 60% with grade 2 DD.  There was evidence of moderate aortic root dilation at 4.3 cm at that time.  CATH: 10/03/2011:  1. Left main; normal  2. LAD; normal 3. Left circumflex; normal and nondominant.  4. Right coronary artery; normal and dominant 5. Left ventriculography; RAO left ventriculogram was performed using  25 cc of Visipaque dye at 12 cc per second. There were no wall motion abnormalities.the EF was estimated visually at 60%.  IMPRESSION:Mr. Johnpaul has normal coronary arteries and normal left ventricular function. Believe his chest pain is chest wall from coughing and his EKG  or represents early repolarization pattern. Continued medical therapy  will be recommended. The sheath was removed and it a TR band is placed on the right wrist chief patent hemostasis. The patient left the Cath Lab in stable condition. Initially hydrated and discharged home in the morning.  2D echo 05/16/2019 IMPRESSIONS   1. Left ventricular ejection fraction, by estimation, is 35 to 40%. The  left ventricle has moderately decreased function. The left ventricle  demonstrates global hypokinesis. Indeterminate diastolic filling due to  E-A fusion.  2. Right ventricular systolic function is low normal. The right  ventricular size is normal. Tricuspid regurgitation signal is inadequate  for assessing PA pressure.  3. The mitral valve is normal in structure. Mild mitral valve  regurgitation.  4. The aortic valve is tricuspid. Aortic valve regurgitation is mild. No  aortic stenosis is present.  5. Aortic dilatation noted. There is moderate dilatation of the aortic  root and of the ascending aorta measuring 46 mm.  6. The inferior vena cava is normal in size with greater than 50%  respiratory variability, suggesting right atrial pressure of 3 mmHg.   Nuclear stress test 05/18/2019 IMPRESSION: 1. No reversible ischemia or infarction.  2. Hypokinesia of the septal wall.  3. Left ventricular ejection fraction 38%  4. Non invasive risk stratification*: Intermediate  Patient Profile     59 y.o. male  with a hx of hypertension, thoracic aortic aneurysm, aortic root aneurysm, tobacco and alcohol abuse who is being seen today for the evaluation of acute CHF at the request of Dr Jacqulyn Bath.  Assessment & Plan    1.  Acute systolic CHF: -CXR with increased opacity in the left lung and right lung base compared to previous imaging representing developing PNA -BNP on presentation >2000 with LE edema and orthopnea symptoms consistent with presumed systolic dysfunction -Echo with moderate LV dysfunction with EF 35-40% -hsTrop found to be mildly elevated at 44  with repeat at 55 not necessarily consistent with ACS given significant fluid volume overload and presumed systolic CHF -Started on IV Lasix 40 mg twice daily -he put out 1.3L yesterday and is net neg 7L since admit -weights do not appear accurate -he appears euvolemic on exam today  -creatinine has improved  (0.98>1.2>1.3>1.27>1.42) -Continue Carvedilol 6.25mg  BID and Lasix 20mg  daily -follow renal function closely -changing Losartan to Entresto 24-26mg  BID  2. 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 2014 with CVTS yearly  -For now we will focus on good BP control -Continue carvedilol 6.25mg  BID -changing Losartan to Entresto for LV dysfunction -LDL 87 and goal is < 70 -add lipitor 20mg  daily -will need FLP and ALT In 6 weeks  3. Sinus tachycardia: -TSH normal  -likely related to underlying LV dysfunction -HR improved after increasing BB  3.  Unintentional weight loss: -Pt reports unintentional weight loss over the last year>>185lb down to 116lb  -He states he has had poor oral intake secondary to difficulty swallowing however concerning given CAP PNA treated with outpatient oral antibiotics for 6 weeks without resolution.  CTA performed during this hospitalization with right upper lobe opacity concerning for cavitating pneumonia measuring 5.2 x 3.1 cm with recommendations for follow-up CT scan in 2 to 3 weeks after treatment to ensure resolution and  rule out underlying lung malignancy. -Albumin, 2.7 -TSH found to be 2.063>>WNL  -Management per primary team  4. Hypertensive urgency: -Patient found to be hypertensive on admission -BP much improved at 129/65mmHg this am -On PTA amlodipine 10mg  PO QD -Amlodipine stopped -continue carvedilol  6.25mg  BID -change Losartan to Entresto 24-26mg  BID  5.  Tobacco and alcohol abuse: -Smoking cessation strongly encouraged -Reports 8-12 beers per day -CIWA protocol per primary team  6.  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   Medication Recommendations:  Carvedilol 6.25mg  BID, lasix 20mg  daily, Entresto 24-26mg  BID and Lipitor 20mg  daily Other recommendations (labs, testing, etc):  BMET in 1 week and FLP and ALT in 6 weeks Follow up as an outpatient:  TOC followup in our office in 7-10 days   I have spent a total of 35 minutes with patient reviewing hospital notes, 2D echo , telemetry, EKGs, labs and examining patient as well as establishing an assessment and plan that was discussed with the patient.  > 50% of time was spent in direct patient care.    Signed, , MD [facility] Pager: [phone] 05/20/2019, 8:08 AM     For questions or updates, please contact   Please consult [email or web address] for contact info under Cardiology/STEMI.

## 2019-05-20 NOTE — Progress Notes (Signed)
Pharmacy Antibiotic Note  Allen Saunders is a 59 y.o. male admitted on 05/15/2019 for aspiration pneumonia.  Pharmacy has been consulted for Zosyn dosing. Pt is afebrile and WBC is WNL.  PCT < 0.10. vital signs are stable. Renal function continues to worsen with increase in Scr from 0.98 to 1.42  Plan: Continue zosyn 3.375 mg IV every 8 hours Monitor renal function and clinical status Follow-up cultures for de-escalation and establish length of therapy  Height: 6\' 5"  (195.6 cm) Weight: 55.1 kg (121 lb 8 oz) IBW/kg (Calculated) : 89.1  Temp (24hrs), Avg:98.2 F (36.8 C), Min:98.2 F (36.8 C), Max:98.2 F (36.8 C)  Recent Labs  Lab 05/15/19 0816 05/15/19 0816 05/15/19 1026 05/15/19 1413 05/15/19 1413 05/16/19 0438 05/17/19 0348 05/18/19 0459 05/19/19 0446 05/20/19 0435  WBC 7.4  --   --  7.4  --  6.3 7.4  --   --   --   CREATININE 0.72   < >  --  0.76   < > 0.98 1.20 1.32* 1.27* 1.42*  LATICACIDVEN 4.6*  --  2.3*  --   --   --   --   --   --   --    < > = values in this interval not displayed.    Estimated Creatinine Clearance: 44.2 mL/min (A) (by C-G formula based on SCr of 1.42 mg/dL (H)).    Allergies  Allergen Reactions  . Asa [Aspirin] Other (See Comments)    Sore throat, gi upset    Antimicrobials this admission: Vanc 4/5>> Cefepime 4/5>>4/6 Zosyn 4/6>>  Dose adjustments this admission: N/A  Microbiology results: 4/5 MRSA PCR: negative 4/5 COVID: negative 4/5 Sput: Gram + cocci and gram + rods 4/5 BCx x2: NGTD x 5 4/5 UCx: negative     6/5, PharmD PGY1 Acute Care Pharmacy Resident 05/20/19     11:53 AM  Please check AMION for all [facility] Pharmacy phone numbers After 10:00 PM, call the Main Pharmacy [phone]

## 2019-05-20 NOTE — Progress Notes (Signed)
Progress Note  Patient Name: Allen Saunders Date of Encounter: 05/20/2019  Primary Cardiologist: New to [facility]  Subjective   Denies any chest pain or SOB.   Inpatient Medications    Scheduled Meds: . carvedilol  6.25 mg Oral BID WC  . enoxaparin (LOVENOX) injection  40 mg Subcutaneous Q24H  . feeding supplement (ENSURE ENLIVE)  237 mL Oral TID BM  . folic acid  1 mg Oral Daily  . furosemide  20 mg Oral Daily  . hydrocortisone cream   Topical BID  . multivitamin with minerals  1 tablet Oral Daily  . sacubitril-valsartan  1 tablet Oral BID  . thiamine  100 mg Oral Daily  . vitamin B-12  1,000 mcg Oral Daily   Continuous Infusions: . piperacillin-tazobactam (ZOSYN)  IV 3.375 g (05/20/19 0557)   PRN Meds: acetaminophen **OR** acetaminophen, ipratropium-albuterol, labetalol, nitroGLYCERIN, ondansetron **OR** ondansetron (ZOFRAN) IV   Vital Signs    Vitals:   05/19/19 0743 05/19/19 1123 05/20/19 0100 05/20/19 0501  BP: 130/85 120/76  129/80  Pulse: 73 76  75  Resp: 18 18  20   Temp: 98.4 F (36.9 C) 98.1 F (36.7 C)  98.2 F (36.8 C)  TempSrc: Oral Oral  Oral  SpO2: 99% 100%  99%  Weight:   55.1 kg   Height:        Intake/Output Summary (Last 24 hours) at 05/20/2019 0826 Last data filed at 05/20/2019 0100 Gross per 24 hour  Intake 480 ml  Output 1300 ml  Net -820 ml   Filed Weights   05/18/19 0453 05/19/19 0021 05/20/19 0100  Weight: 53 kg 53.9 kg 55.1 kg    Physical Exam   GEN: Well nourished, well developed in no acute distress HEENT: normal NECK: No JVD; No carotid bruits LYMPHATICS: No lymphadenopathy CARDIAC:RRR, no murmurs, rubs, gallops RESPIRATORY:  Clear to auscultation without rales, wheezing or rhonchi  ABDOMEN: Soft, non-tender, non-distended MUSCULOSKELETAL:  No edema; No deformity  SKIN: Warm and dry NEUROLOGIC:  Alert and oriented x 3 PSYCHIATRIC:  Normal affect    Labs    Chemistry Recent Labs  Lab 05/15/19 0816 05/15/19 1413  05/16/19 0438 05/16/19 4098 05/17/19 0348 05/17/19 0348 05/18/19 0459 05/19/19 0446 05/20/19 0435  NA 139  --  142   < > 141   < > 139 140 140  K 3.4*  --  3.4*   < > 3.1*   < > 3.8 3.6 3.5  CL 105  --  107   < > 102   < > 103 103 102  CO2 18*  --  25   < > 28   < > 26 25 27   GLUCOSE 92  --  84   < > 89   < > 84 87 82  BUN 6  --  6   < > 9   < > 10 12 17   CREATININE 0.72   < > 0.98   < > 1.20   < > 1.32* 1.27* 1.42*  CALCIUM 8.9  --  9.1   < > 9.2   < > 9.0 9.0 9.0  PROT 7.4  --  6.6  --  7.2  --   --   --   --   ALBUMIN 2.7*  --  2.4*  --  2.5*  --   --   --   --   AST 19  --  19  --  16  --   --   --   --  ALT 11  --  10  --  11  --   --   --   --   ALKPHOS 75  --  58  --  66  --   --   --   --   BILITOT 0.7  --  1.4*  --  1.1  --   --   --   --   GFRNONAA >60   < > >60   < > >60   < > 59* >60 54*  GFRAA >60   < > >60   < > >60   < > >60 >60 >60  ANIONGAP 16*  --  10   < > 11   < > 10 12 11    < > = values in this interval not displayed.     Hematology Recent Labs  Lab 05/15/19 1413 05/16/19 0438 05/17/19 0348  WBC 7.4 6.3 7.4  RBC 3.12* 2.79* 3.21*  HGB 10.7* 9.6* 10.8*  HCT 35.3* 30.6* 34.7*  MCV 113.1* 109.7* 108.1*  MCH 34.3* 34.4* 33.6  MCHC 30.3 31.4 31.1  RDW 19.9* 19.4* 18.4*  PLT 228 224 245    Cardiac EnzymesNo results for input(s): TROPONINI in the last 168 hours. No results for input(s): TROPIPOC in the last 168 hours.   BNP Recent Labs  Lab 05/15/19 0816  BNP 2,123.1*     DDimer  Recent Labs  Lab 05/15/19 0816  DDIMER 1.49*     Radiology    NM Myocar Multi W/Spect W/Wall Motion / EF  Result Date: 05/18/2019 CLINICAL DATA:  59 year old male with a history of cardiomyopathy EXAM: MYOCARDIAL IMAGING WITH SPECT (REST AND PHARMACOLOGIC-STRESS) GATED LEFT VENTRICULAR WALL MOTION STUDY LEFT VENTRICULAR EJECTION FRACTION TECHNIQUE: Standard myocardial SPECT imaging was performed after resting intravenous injection of 10 mCi Tc-37m tetrofosmin.  Subsequently, intravenous infusion of Lexiscan was performed under the supervision of the Cardiology staff. At peak effect of the drug, 30 mCi Tc-19m tetrofosmin was injected intravenously and standard myocardial SPECT imaging was performed. Quantitative gated imaging was also performed to evaluate left ventricular wall motion, and estimate left ventricular ejection fraction. COMPARISON:  None. FINDINGS: Perfusion: No decreased activity in the left ventricle on stress imaging to suggest reversible ischemia or infarction. Wall Motion: Hypokinesia of the septal wall. Left Ventricular Ejection Fraction: 38 % End diastolic volume 147 ml End systolic volume 91 ml IMPRESSION: 1. No reversible ischemia or infarction. 2. Hypokinesia of the septal wall. 3. Left ventricular ejection fraction 38% 4. Non invasive risk stratification*: Intermediate *2012 Appropriate Use Criteria for Coronary Revascularization Focused Update: J Am Coll Cardiol. 2012;59(9):857-881. [email or web address]?[email or web address] Electronically Signed   By: [email or web address] D.O.   On: 05/18/2019 13:46   Telemetry    NSR - Personally Reviewed  ECG    No new EKG to review- Personally Reviewed  Cardiac Studies   ECHO: 12/10/2016:  Scanned document: LVEF at 55 to 60% with grade 2 DD.  There was evidence of moderate aortic root dilation at 4.3 cm at that time.  CATH: 10/03/2011:  1. Left main; normal  2. LAD; normal 3. Left circumflex; normal and nondominant.  4. Right coronary artery; normal and dominant 5. Left ventriculography; RAO left ventriculogram was performed using  25 cc of Visipaque dye at 12 cc per second. There were no wall motion abnormalities.the EF was estimated visually at 60%.  IMPRESSION:Mr. Clearence has normal coronary arteries and normal left ventricular function. Believe his chest pain is chest wall from coughing and  his EKG or represents early repolarization pattern. Continued medical therapy will  be recommended. The sheath was removed and it a TR band is placed on the right wrist chief patent hemostasis. The patient left the Cath Lab in stable condition. Initially hydrated and discharged home in the morning.  2D echo 05/16/2019 IMPRESSIONS   1. Left ventricular ejection fraction, by estimation, is 35 to 40%. The  left ventricle has moderately decreased function. The left ventricle  demonstrates global hypokinesis. Indeterminate diastolic filling due to  E-A fusion.  2. Right ventricular systolic function is low normal. The right  ventricular size is normal. Tricuspid regurgitation signal is inadequate  for assessing PA pressure.  3. The mitral valve is normal in structure. Mild mitral valve  regurgitation.  4. The aortic valve is tricuspid. Aortic valve regurgitation is mild. No  aortic stenosis is present.  5. Aortic dilatation noted. There is moderate dilatation of the aortic  root and of the ascending aorta measuring 46 mm.  6. The inferior vena cava is normal in size with greater than 50%  respiratory variability, suggesting right atrial pressure of 3 mmHg.   Nuclear stress test 05/18/2019 IMPRESSION: 1. No reversible ischemia or infarction.  2. Hypokinesia of the septal wall.  3. Left ventricular ejection fraction 38%  4. Non invasive risk stratification*: Intermediate  Patient Profile     59 y.o. male  with a hx of hypertension, thoracic aortic aneurysm, aortic root aneurysm, tobacco and alcohol abuse who is being seen today for the evaluation of acute CHF at the request of Dr Jacqulyn Bath.  Assessment & Plan    1.  Acute systolic CHF: -CXR with increased opacity in the left lung and right lung base compared to previous imaging representing developing PNA -BNP on presentation >2000 with LE edema and orthopnea symptoms consistent with presumed systolic dysfunction -Echo with moderate LV dysfunction with EF 35-40% -hsTrop found to be mildly elevated at 44 with  repeat at 55 not necessarily consistent with ACS given significant fluid volume overload and presumed systolic CHF -Started on IV Lasix 40 mg twice daily -he put out 1.3L yesterday and is net neg 7L since admit -weights do not appear accurate -he appears euvolemic on exam today  -creatinine has improved  (0.98>1.2>1.3>1.27>1.42) -Continue Carvedilol 6.25mg  BID and Lasix 20mg  daily -follow renal function closely -changing Losartan to Entresto 24-26mg  BID  2. 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 Sinus tachycardia: -TSH normal  -likely related to underlying LV dysfunction -HR improved after increasing BB  3.  Unintentional weight loss: -Pt reports unintentional weight loss over the last year>>185lb down to 116lb  -He states he has had poor oral intake secondary to difficulty swallowing however concerning given CAP PNA treated with outpatient oral antibiotics for 6 weeks without resolution.  CTA performed during this hospitalization with right upper lobe opacity concerning for cavitating pneumonia measuring 5.2 x 3.1 cm with recommendations for follow-up CT scan in 2 to  3 weeks after treatment to ensure resolution and rule out underlying lung malignancy. -Albumin, 2.7 -TSH found to be 2.063>>WNL  -Management per primary team  4. Hypertensive urgency: -Patient found to be hypertensive on admission -BP much improved at 129/66mmHg this am -On PTA amlodipine  10mg  PO QD -Amlodipine stopped -continue carvedilol 6.25mg  BID -change Losartan to Entresto 24-26mg  BID  5.  Tobacco and alcohol abuse: -Smoking cessation strongly encouraged -Reports 8-12 beers per day -CIWA protocol per primary team  6.  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  > 50% of time was spent in direct patient care.    Signed, [place], MD [facility] Pager: [phone] 05/20/2019, 8:26 AM     For questions or updates, please contact   Please consult [email or web address] for contact info under Cardiology/STEMI.

## 2019-05-20 NOTE — Discharge Instructions (Signed)
Heart Failure Education: 1. Weigh yourself EVERY morning after you go to the bathroom but before you eat or drink anything. Write this number down in a weight log/diary. If you gain 3 pounds overnight or 5 pounds in a week, call the office. 2. Take your medicines as prescribed. If you have concerns about your medications, please call us before you stop taking them.  3. Eat low salt foods--Limit salt (sodium) to 2000 mg per day. This will help prevent your body from holding onto fluid. Read food labels as many processed foods have a lot of sodium, especially canned goods and prepackaged meats. If you would like some assistance choosing low sodium foods, we would be happy to set you up with a nutritionist. 4. Stay as active as you can everyday. Staying active will give you more energy and make your muscles stronger. Start with 5 minutes at a time and work your way up to 30 minutes a day. Break up your activities--do some in the morning and some in the afternoon. Start with 3 days per week and work your way up to 5 days as you can.  If you have chest pain, feel short of breath, dizzy, or lightheaded, STOP. If you don't feel better after a short rest, call 911. If you do feel better, call the office to let us know you have symptoms with exercise. 5. Limit all fluids for the day to less than 2 liters. Fluid includes all drinks, coffee, juice, ice chips, soup, jello, and all other liquids.   Low-Sodium Eating Plan Sodium, which is an element that makes up salt, helps you maintain a healthy balance of fluids in your body. Too much sodium can increase your blood pressure and cause fluid and waste to be held in your body. Your health care provider or dietitian may recommend following this plan if you have high blood pressure (hypertension), kidney disease, liver disease, or heart failure. Eating less sodium can help lower your blood pressure, reduce swelling, and protect your heart, liver, and kidneys. What are  tips for following this plan? General guidelines  Most people on this plan should limit their sodium intake to 1,500-2,000 mg (milligrams) of sodium each day. Reading food labels   The Nutrition Facts label lists the amount of sodium in one serving of the food. If you eat more than one serving, you must multiply the listed amount of sodium by the number of servings.  Choose foods with less than 140 mg of sodium per serving.  Avoid foods with 300 mg of sodium or more per serving. Shopping  Look for lower-sodium products, often labeled as "low-sodium" or "no salt added."  Always check the sodium content even if foods are labeled as "unsalted" or "no salt added".  Buy fresh foods. ? Avoid canned foods and premade or frozen meals. ? Avoid canned, cured, or processed meats  Buy breads that have less than 80 mg of sodium per slice. Cooking  Eat more home-cooked food and less restaurant, buffet, and fast food.  Avoid adding salt when cooking. Use salt-free seasonings or herbs instead of table salt or sea salt. Check with your health care provider or pharmacist before using salt substitutes.  Cook with plant-based oils, such as canola, sunflower, or olive oil. Meal planning  When eating at a restaurant, ask that your food be prepared with less salt or no salt, if possible.  Avoid foods that contain MSG (monosodium glutamate). MSG is sometimes added to [place] food, bouillon, and  some canned foods. What foods are recommended? The items listed may not be a complete list. Talk with your dietitian about what dietary choices are best for you. Grains Low-sodium cereals, including oats, puffed wheat and rice, and shredded wheat. Low-sodium crackers. Unsalted rice. Unsalted pasta. Low-sodium bread. Whole-grain breads and whole-grain pasta. Vegetables Fresh or frozen vegetables. "No salt added" canned vegetables. "No salt added" tomato sauce and paste. Low-sodium or reduced-sodium tomato and  vegetable juice. Fruits Fresh, frozen, or canned fruit. Fruit juice. Meats and other protein foods Fresh or frozen (no salt added) meat, poultry, seafood, and fish. Low-sodium canned tuna and salmon. Unsalted nuts. Dried peas, beans, and lentils without added salt. Unsalted canned beans. Eggs. Unsalted nut butters. Dairy Milk. Soy milk. Cheese that is naturally low in sodium, such as ricotta cheese, fresh mozzarella, or Swiss cheese Low-sodium or reduced-sodium cheese. Cream cheese. Yogurt. Fats and oils Unsalted butter. Unsalted margarine with no trans fat. Vegetable oils such as canola or olive oils. Seasonings and other foods Fresh and dried herbs and spices. Salt-free seasonings. Low-sodium mustard and ketchup. Sodium-free salad dressing. Sodium-free light mayonnaise. Fresh or refrigerated horseradish. Lemon juice. Vinegar. Homemade, reduced-sodium, or low-sodium soups. Unsalted popcorn and pretzels. Low-salt or salt-free chips. What foods are not recommended? The items listed may not be a complete list. Talk with your dietitian about what dietary choices are best for you. Grains Instant hot cereals. Bread stuffing, pancake, and biscuit mixes. Croutons. Seasoned rice or pasta mixes. Noodle soup cups. Boxed or frozen macaroni and cheese. Regular salted crackers. Self-rising flour. Vegetables Sauerkraut, pickled vegetables, and relishes. Olives. [place] fries. Onion rings. Regular canned vegetables (not low-sodium or reduced-sodium). Regular canned tomato sauce and paste (not low-sodium or reduced-sodium). Regular tomato and vegetable juice (not low-sodium or reduced-sodium). Frozen vegetables in sauces. Meats and other protein foods Meat or fish that is salted, canned, smoked, spiced, or pickled. Bacon, ham, sausage, hotdogs, corned beef, chipped beef, packaged lunch meats, salt pork, jerky, pickled herring, anchovies, regular canned tuna, sardines, salted nuts. Dairy Processed cheese and  cheese spreads. Cheese curds. Blue cheese. Feta cheese. String cheese. Regular cottage cheese. Buttermilk. Canned milk. Fats and oils Salted butter. Regular margarine. Ghee. Bacon fat. Seasonings and other foods Onion salt, garlic salt, seasoned salt, table salt, and sea salt. Canned and packaged gravies. Worcestershire sauce. Tartar sauce. Barbecue sauce. Teriyaki sauce. Soy sauce, including reduced-sodium. Steak sauce. Fish sauce. Oyster sauce. Cocktail sauce. Horseradish that you find on the shelf. Regular ketchup and mustard. Meat flavorings and tenderizers. Bouillon cubes. Hot sauce and Tabasco sauce. Premade or packaged marinades. Premade or packaged taco seasonings. Relishes. Regular salad dressings. Salsa. Potato and tortilla chips. Corn chips and puffs. Salted popcorn and pretzels. Canned or dried soups. Pizza. Frozen entrees and pot pies. Summary  Eating less sodium can help lower your blood pressure, reduce swelling, and protect your heart, liver, and kidneys.  Most people on this plan should limit their sodium intake to 1,500-2,000 mg (milligrams) of sodium each day.  Canned, boxed, and frozen foods are high in sodium. Restaurant foods, fast foods, and pizza are also very high in sodium. You also get sodium by adding salt to food.  Try to cook at home, eat more fresh fruits and vegetables, and eat less fast food, canned, processed, or prepared foods. This information is not intended to replace advice given to you by your health care provider. Make sure you discuss any questions you have with your health care provider. Document Revised: 01/08/2017  Document Reviewed: 01/20/2016 Elsevier Patient Education  [place].   Additional discharge instructions  Please get your medications reviewed and adjusted by your Primary MD.  Please request your Primary MD to go over all Hospital Tests and Procedure/Radiological results at the follow up, please get all Hospital records sent to  your Prim MD by signing hospital release before you go home.  If you had Pneumonia of Lung problems at the Hospital: Please get a 2 view Chest X ray done in 6-8 weeks after hospital discharge or sooner if instructed by your Primary MD.  If you have Congestive Heart Failure: Please call your Cardiologist or Primary MD anytime you have any of the following symptoms:  1) 3 pound weight gain in 24 hours or 5 pounds in 1 week  2) shortness of breath, with or without a dry hacking cough  3) swelling in the hands, feet or stomach  4) if you have to sleep on extra pillows at night in order to breathe  Follow cardiac low salt diet and 1.5 lit/day fluid restriction.  If you have diabetes Accuchecks 4 times/day, Once in AM empty stomach and then before each meal. Log in all results and show them to your primary doctor at your next visit. If any glucose reading is under 80 or above 300 call your primary MD immediately.  If you have Seizure/Convulsions/Epilepsy: Please do not drive, operate heavy machinery, participate in activities at heights or participate in high speed sports until you have seen by Primary MD or a Neurologist and advised to do so again.  If you had Gastrointestinal Bleeding: Please ask your Primary MD to check a complete blood count within one week of discharge or at your next visit. Your endoscopic/colonoscopic biopsies that are pending at the time of discharge, will also need to followed by your Primary MD.  Get Medicines reviewed and adjusted. Please take all your medications with you for your next visit with your Primary MD  Please request your Primary MD to go over all hospital tests and procedure/radiological results at the follow up, please ask your Primary MD to get all Hospital records sent to his/her office.  If you experience worsening of your admission symptoms, develop shortness of breath, life threatening emergency, suicidal or homicidal thoughts you must seek  medical attention immediately by calling 911 or calling your MD immediately  if symptoms less severe.  You must read complete instructions/literature along with all the possible adverse reactions/side effects for all the Medicines you take and that have been prescribed to you. Take any new Medicines after you have completely understood and accpet all the possible adverse reactions/side effects.   Do not drive or operate heavy machinery when taking Pain medications.   Do not take more than prescribed Pain, Sleep and Anxiety Medications  Special Instructions: If you have smoked or chewed Tobacco  in the last 2 yrs please stop smoking, stop any regular Alcohol  and or any Recreational drug use.  Wear Seat belts while driving.  Please note You were cared for by a hospitalist during your hospital stay. If you have any questions about your discharge medications or the care you received while you were in the hospital after you are discharged, you can call the unit and asked to speak with the hospitalist on call if the hospitalist that took care of you is not available. Once you are discharged, your primary care physician will handle any further medical issues. Please note that NO REFILLS  for any discharge medications will be authorized once you are discharged, as it is imperative that you return to your primary care physician (or establish a relationship with a primary care physician if you do not have one) for your aftercare needs so that they can reassess your need for medications and monitor your lab values.  You can reach the hospitalist office at phone [phone] or fax [phone]   If you do not have a primary care physician, you can call [phone] for a physician referral.

## 2019-05-20 NOTE — Discharge Summary (Signed)
Physician Discharge Summary  Allen Saunders:[id number] DOB: October 04, 1960  PCP: [name], MD  Admitted from: Home Discharged to: Home  Admit date: 05/15/2019 Discharge date: 05/20/2019  Recommendations for Outpatient Follow-up:   Follow-up Information    [name], PA-C Follow up on 05/31/2019.   Specialties: Cardiology, Radiology Why: Have a Basic Metabolic Panel in 1 week. Please arrive 15 minutes early for your 3:15pm post-hospital cardiology follow-up appointment Contact information: [address] [place] [address] [phone]        [name], MD. Schedule an appointment as soon as possible for a visit in 1 week(s).   Specialty: Family Medicine Why: To be seen with repeat labs (CBC & CMP).  Recommend repeating CT chest in 2-3 weeks to reassess right upper lobe cavitary pneumonia.  Recommend pulmonology consultation and cardiology follow-up at the [facility]. Consider oncology reconsultation as well. Contact information: [address] [place] [address] [phone]        [name], MD .   Specialty: Cardiology Contact information: [address] [place] [address] [phone]            Home Health: None Equipment/Devices: None  Discharge Condition: Improved and stable CODE STATUS: Full Diet recommendation: Heart healthy diet  Discharge Diagnoses:  Principal Problem:   Acute hypoxemic respiratory failure (HCC) Active Problems:   Hypertension   Thoracic aortic aneurysm, without rupture (HCC)   Hypokalemia   Elevated troponin   Lactic acidosis   Macrocytic anemia   Fluid overload   Cavitary pneumonia   Tobacco abuse   Alcohol abuse   Leg edema   Weight loss   Malnutrition (HCC)   Peripheral edema   Sinus tachycardia   Protein-calorie malnutrition, severe   Brief Summary: 59 year old married male, moves around with the help of a cane/walker and a scooter at home, PMH of  hypertension, thoracic aortic aneurysm, tobacco and alcohol abuse presented with progressive dyspnea, associated bilateral leg swelling, orthopnea, cough productive of pinkish-red sputum.  He also reported chills, intermittent chest pain, generalized weakness, lethargy and night sweats.  He was evaluated by his PCP at the [place] and he was also referred to oncology at the [facility] but eventually cleared per patient.  He was admitted for acute hypoxic respiratory failure due to right upper lobe cavitary pneumonia and new onset dilated cardiomyopathy with acute systolic CHF.  Cardiology consulted.   Assessment & Plan:   Acute hypoxic respiratory failure due to right upper lobe cavitary pneumonia and new onset acute systolic CHF/sepsis due to pneumonia, POA: CTA chest: No PE.  Large right upper lobe opacity noted concerning for cavitary pneumonia. Initially started on vancomycin and Zosyn for cavitary pneumonia seen on CT.  However for better anaerobic coverage, cefepime was switched to Zosyn.  MRSA PCR negative and hence discontinued vancomycin.  Blood cultures x2: Negative.  Urine culture negative.  COVID-19 testing negative.  Sputum culture: Normal respiratory flora.  Patient completed total 6 days of IV antibiotics.  I discussed with infectious disease MD on call today who recommended Augmentin x9 days at discharge to complete total 15 days course with close outpatient follow-up in the [place] with his PCP and likely will need Pulmonology consultation.  Recommend repeating CT chest in 2-3 weeks to ensure resolution of cavitary pneumonia.  If not resolved, he may need bronchoscopy to further evaluate.  Also suggest Oncology follow-up at the [place] due to severe weight loss and to rule  out a nonbenign process.  Cardiology was consulted and assisted with management of his new onset cardiomyopathy and acute systolic CHF.  TTE: LVEF 35-40% with global hypokinesis.  Treated initially with IV Lasix 40 mg twice daily.  - 7.089 L  thus far. Coronary CTA was initially planned but since unable to get heart rate adequately low, he underwent nuclear stress test: No reversible ischemia or infarction.  Hypokinesis of the septal wall.  LVEF 38%.  Briefly on low-dose losartan but switched to [facility].  Metoprolol was switched to carvedilol.  Patient developed mild AKI and his BMP will need to be closely monitored on current cardiac regimen. Patient has received a discount card for 1 month free Entresto and he is advised to follow-up with the [place] for subsequent supplies.  Cardiology suspect that his dilated cardiomyopathy is due to alcohol, abstinence counseled.  Cardiology follow-up appreciated.  I discussed with the Cardiologist and he has been cleared for discharge home. They have arranged close outpatient follow-up with labs in case he is not able to follow-up at the [place].  Discharge medications: Lasix 20 mg daily, Carvedilol 6.25 mg twice daily, Atorvastatin  20 mg daily.  Labs including BMP in 1 week and FLP and LFTs in 6 weeks.  Hypoxia has resolved.  Bilateral lower extremity venous Dopplers negative for DVT.    Hypokalemia: Replaced.  Magnesium normal.  Weight loss: Concerning.  He does have history of alcohol abuse but reportedly quit 2 months PTA.  He reports poor oral intake over the last several months due to poor dentition.  He apparently was evaluated by Oncology at the [facility] and per his report he is now cleared.  He will need to be treated for cavitary pneumonia and will need repeat CTA in several weeks to reassess and rule out underlying malignancy.  Outpatient follow-up  Macrocytic anemia/B12 deficiency: Stable.  May be related to prior alcohol abuse and B12 deficiency.  Continue B12 supplements at DC  Elevated troponin: No anginal symptoms.  Less likely due to ACS.  Likely due to demand ischemia from hypoxia, pneumonia and acute CHF.  Cardiology on board and abnormal stress test noted, no further work-up  planned.  Essential hypertension: Controlled.  Amlodipine now discontinued.    At discharge patient will be on carvedilol and Entresto.  Tobacco abuse: Nicotine patch declined by patient.  Cessation counseled.  Alcohol abuse: Repeatedly states that he has quit 2 months ago and appears frustrated to discuss.  No withdrawal features noted.  Continue thiamine, folate and multivitamins  Body mass index is 14.1 kg/m.  Nutritional Status Nutrition Problem: Severe Malnutrition Etiology: chronic illness(CHF, ETOH abuse) Signs/Symptoms: severe fat depletion, moderate muscle depletion, severe muscle depletion Interventions: Ensure Enlive (each supplement provides 350kcal and 20 grams of protein), Magic cup, MVI   4.5 cm ascending thoracic aortic aneurysm: Recommend semiannual imaging follow-up by CTA or MRA.  Aortic root is aneurysmal measuring 5.1 cm in diameter.  Patient reports that he already has regular follow-up for this.  Macrocytic anemia: Likely multifactorial due to anemia of chronic disease and B12 deficiency.  Continue B12 supplements.  Stable.  Follow CBC as outpatient.   Consultants:   Cardiology  Procedures:   None   Discharge Instructions  Discharge Instructions    (HEART FAILURE PATIENTS) Call MD:  Anytime you have any of the following symptoms: 1) 3 pound weight gain in 24 hours or 5 pounds in 1 week 2) shortness of breath, with or without a dry hacking cough  3) swelling in the hands, feet or stomach 4) if you have to sleep on extra pillows at night in order to breathe.   Complete by: As directed    Call MD for:  difficulty breathing, headache or visual disturbances   Complete by: As directed    Call MD for:  extreme fatigue   Complete by: As directed    Call MD for:  persistant dizziness or light-headedness   Complete by: As directed    Call MD for:  persistant nausea and vomiting   Complete by: As directed    Call MD for:  severe uncontrolled pain    Complete by: As directed    Call MD for:  temperature >100.4   Complete by: As directed    Diet - low sodium heart healthy   Complete by: As directed    Increase activity slowly   Complete by: As directed        Medication List    STOP taking these medications   amLODipine 10 MG tablet Commonly known as: NORVASC   triamcinolone cream 0.1 % Commonly known as: KENALOG     TAKE these medications   amoxicillin-clavulanate 875-125 MG tablet Commonly known as: Augmentin Take 1 tablet by mouth 2 (two) times daily for 9 days.   atorvastatin 20 MG tablet Commonly known as: Lipitor Take 1 tablet (20 mg total) by mouth daily.   carvedilol 6.25 MG tablet Commonly known as: COREG Take 1 tablet (6.25 mg total) by mouth 2 (two) times daily with a meal.   cholecalciferol 25 MCG (1000 UNIT) tablet Commonly known as: VITAMIN D3 Take 1,000 Units by mouth daily.   cyanocobalamin 1000 MCG tablet Take 1 tablet (1,000 mcg total) by mouth daily. Start taking on: May 21, 2019   feeding supplement (ENSURE ENLIVE) Liqd Take 237 mLs by mouth 3 (three) times daily between meals.   folic acid 1 MG tablet Commonly known as: FOLVITE Take 1 tablet (1 mg total) by mouth daily. Start taking on: May 21, 2019   furosemide 20 MG tablet Commonly known as: LASIX Take 1 tablet (20 mg total) by mouth daily. Start taking on: May 21, 2019   HM Lidocaine Patch 4 % Ptch Generic drug: Lidocaine Apply 1 patch topically daily as needed (Back pain).   multivitamin with minerals Tabs tablet Take 1 tablet by mouth daily. Start taking on: May 21, 2019   sacubitril-valsartan 24-26 MG Commonly known as: ENTRESTO Take 1 tablet by mouth 2 (two) times daily.   thiamine 50 MG tablet Commonly known as: VITAMIN B-1 Take 50 mg by mouth 2 (two) times daily.      Allergies  Allergen Reactions  . Asa [Aspirin] Other (See Comments)    Sore throat, gi upset      Procedures/Studies: CT Angio  Chest PE W and/or Wo Contrast  Result Date: 05/15/2019 CLINICAL DATA:  Shortness of breath. EXAM: CT ANGIOGRAPHY CHEST WITH CONTRAST TECHNIQUE: Multidetector CT imaging of the chest was performed using the standard protocol during bolus administration of intravenous contrast. Multiplanar CT image reconstructions and MIPs were obtained to evaluate the vascular anatomy. CONTRAST:  15mL OMNIPAQUE IOHEXOL 350 MG/ML SOLN COMPARISON:  December 07, 2018. FINDINGS: Cardiovascular: Satisfactory opacification of the pulmonary arteries to the segmental level. No evidence of pulmonary embolism. Normal heart size. No pericardial effusion. Atherosclerosis of thoracic aorta is noted. 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 Mediastinum/Nodes: No enlarged mediastinal, hilar,  or axillary lymph nodes. Thyroid gland, trachea, and esophagus demonstrate no significant findings. Lungs/Pleura: No pneumothorax is noted. Small bilateral pleural effusions are noted. Mild bibasilar subsegmental atelectasis is noted. Mild emphysematous disease is noted in both upper lobes. There is new large right upper lobe opacity concerning for cavitating pneumonia measuring 5.2 x 3.1 cm. Upper Abdomen: No acute abnormality. Musculoskeletal: No chest wall abnormality. No acute or significant osseous findings. Review of the MIP images confirms the above findings. IMPRESSION: 1. No definite evidence of pulmonary embolus. 2. Small bilateral pleural effusions are noted with mild bibasilar subsegmental atelectasis. 3. Large right upper lobe opacity is noted concerning for cavitating pneumonia. Follow-up CT scan in 2-3 weeks after treatment is recommended to ensure resolution and rule out underlying malignancy. 4. 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 Ascending thoracic aortic aneurysm. Recommend semi-annual imaging followup by CTA or MRA and referral to cardiothoracic surgery if not  already obtained. This recommendation follows 2010 ACCF/AHA/AATS/ACR/ASA/SCA/SCAI/SIR/STS/SVM Guidelines for the Diagnosis and Management of Patients With Thoracic Aortic Disease. Circulation. 2010; 121: L875-I433. Aortic aneurysm NOS (ICD10-I71.9). 5. Aortic root is aneurysmal measuring 5.1 cm in diameter. Aortic Atherosclerosis (ICD10-I70.0) and Emphysema (ICD10-J43.9). Electronically Signed   By: Lupita Raider M.D.   On: 05/15/2019 11:08   NM Myocar Multi W/Spect W/Wall Motion / EF  Result Date: 05/18/2019 CLINICAL DATA:  59 year old male with a history of cardiomyopathy EXAM: MYOCARDIAL IMAGING WITH SPECT (REST AND PHARMACOLOGIC-STRESS) GATED LEFT VENTRICULAR WALL MOTION STUDY LEFT VENTRICULAR EJECTION FRACTION TECHNIQUE: Standard myocardial SPECT imaging was performed after resting intravenous injection of 10 mCi Tc-51m tetrofosmin. Subsequently, intravenous infusion of Lexiscan was performed under the supervision of the Cardiology staff. At peak effect of the drug, 30 mCi Tc-83m tetrofosmin was injected intravenously and standard myocardial SPECT imaging was performed. Quantitative gated imaging was also performed to evaluate left ventricular wall motion, and estimate left ventricular ejection fraction. COMPARISON:  None. FINDINGS: Perfusion: No decreased activity in the left ventricle on stress imaging to suggest reversible ischemia or infarction. Wall Motion: Hypokinesia of the septal wall. Left Ventricular Ejection Fraction: 38 % End diastolic volume 147 ml End systolic volume 91 ml IMPRESSION: 1. No reversible ischemia or infarction. 2. Hypokinesia of the septal wall. 3. Left ventricular ejection fraction 38% 4. Non invasive risk stratification*: Intermediate *2012 Appropriate Use Criteria for Coronary Revascularization Focused Update: J Am Coll Cardiol. 2012;59(9):857-881. http://content.dementiazones.com.aspx?articleid=[email or web address] Electronically Signed   By: Gilmer Mor D.O.   On: 05/18/2019  13:46   DG Chest Portable 1 View  Result Date: 05/15/2019 CLINICAL DATA:  Cough shortness of breath bilateral leg swelling EXAM: PORTABLE CHEST 1 VIEW COMPARISON:  07/22/2018 FINDINGS: Cardiomediastinal contours are stable. Hilar structures are unremarkable. Increased opacity in the left lung base and at the right lung base compared to the previous study. Crescentic area of increased density over the right lung apex. This is new from 07/22/2018 Subacute rib fractures along the right lateral chest. Also with evidence of prior rib fractures on the left similar to CT of 12/07/2018. Visualized skeletal structures are otherwise unremarkable. IMPRESSION: 1. Increased opacity in the left lung base and at the right lung base compared to the previous study which may represent developing pneumonia. 2. Crescentic area of increased density over the right lung apex which is new from 07/22/2018. Findings are of uncertain significance potentially related to pleural thickening or loculated pleural fluid. CT of the chest may be helpful for further assessment. 3. Bilateral rib fractures similar to prior CT  of 12/07/2018 4. Emphysema as before. Electronically Signed   By: Donzetta Kohut M.D.   On: 05/15/2019 08:21   ECHOCARDIOGRAM COMPLETE  Result Date: 05/16/2019    ECHOCARDIOGRAM REPORT   Patient Name:   LEWELLYN FULTZ Date of Exam: 05/16/2019 Medical Rec #:  [id number]  Height:       77.0 in Accession #:    [id number] Weight:       119.0 lb Date of Birth:  [birth year]  BSA:          1.794 m Patient Age:    58 years   BP:           125/94 mmHg Patient Gender: M          HR:           95 bpm. Exam Location:  Inpatient Procedure: 2D Echo, Cardiac Doppler and Color Doppler Indications:    Congestive Heart Failure 428.0  History:        Patient has no prior history of Echocardiogram examinations.                 Risk Factors:Hypertension and Current Smoker. Elevated troponin.  Sonographer:    Renella Cunas RDCS Referring Phys: [id number] Kasandra Knudsen PAHWANI IMPRESSIONS  1. Left ventricular ejection fraction, by estimation, is 35 to 40%. The left ventricle has moderately decreased function. The left ventricle demonstrates global hypokinesis. Indeterminate diastolic filling due to E-A fusion.  2. Right ventricular systolic function is low normal. The right ventricular size is normal. Tricuspid regurgitation signal is inadequate for assessing PA pressure.  3. The mitral valve is normal in structure. Mild mitral valve regurgitation.  4. The aortic valve is tricuspid. Aortic valve regurgitation is mild. No aortic stenosis is present.  5. Aortic dilatation noted. There is moderate dilatation of the aortic root and of the ascending aorta measuring 46 mm.  6. The inferior vena cava is normal in size with greater than 50% respiratory variability, suggesting right atrial pressure of 3 mmHg. Comparison(s): No prior Echocardiogram. FINDINGS  Left Ventricle: Left ventricular ejection fraction, by estimation, is 35 to 40%. The left ventricle has moderately decreased function. The left ventricle demonstrates global hypokinesis. The left ventricular internal cavity size was normal in size. There is no left ventricular hypertrophy. Indeterminate diastolic filling due to E-A fusion. Right Ventricle: The right ventricular size is normal. No increase in right ventricular wall thickness. Right ventricular systolic function is low normal. Tricuspid regurgitation signal is inadequate for assessing PA pressure. Left Atrium: Left atrial size was normal in size. Right Atrium: Right atrial size was normal in size. Pericardium: There is no evidence of pericardial effusion. Mitral Valve: The mitral valve is normal in structure. Mild mitral valve regurgitation, with centrally-directed jet. Tricuspid Valve: The tricuspid valve is normal in structure. Tricuspid valve regurgitation is not demonstrated. Aortic Valve: The aortic valve is tricuspid. Aortic valve regurgitation is mild. No aortic  stenosis is present. Pulmonic Valve: The pulmonic valve was grossly normal. Pulmonic valve regurgitation is not visualized. Aorta: Aortic dilatation noted. There is moderate dilatation of the aortic root and of the ascending aorta measuring 46 mm. Venous: The inferior vena cava is normal in size with greater than 50% respiratory variability, suggesting right atrial pressure of 3 mmHg. IAS/Shunts: No atrial level shunt detected by color flow Doppler.  LEFT VENTRICLE PLAX 2D LVIDd:         3.80 cm      Diastology LVIDs:  3.30 cm      LV e' lateral:   6.73 cm/s LV PW:         1.10 cm      LV E/e' lateral: 9.0 LV IVS:        1.10 cm      LV e' medial:    6.41 cm/s LVOT diam:     2.50 cm      LV E/e' medial:  9.5 LV SV:         59 LV SV Index:   33 LVOT Area:     4.91 cm  LV Volumes (MOD) LV vol d, MOD A2C: 132.0 ml LV vol d, MOD A4C: 147.0 ml LV vol s, MOD A2C: 83.8 ml LV vol s, MOD A4C: 96.2 ml LV SV MOD A2C:     48.2 ml LV SV MOD A4C:     147.0 ml LV SV MOD BP:      46.4 ml RIGHT VENTRICLE RV S prime:     10.30 cm/s TAPSE (M-mode): 1.7 cm LEFT ATRIUM           Index       RIGHT ATRIUM           Index LA diam:      2.50 cm 1.39 cm/m  RA Area:     12.50 cm LA Vol (A2C): 37.2 ml 20.74 ml/m RA Volume:   26.00 ml  14.49 ml/m LA Vol (A4C): 27.5 ml 15.33 ml/m  AORTIC VALVE LVOT Vmax:   73.10 cm/s LVOT Vmean:  50.400 cm/s LVOT VTI:    0.121 m  AORTA Ao Root diam: 4.60 cm Ao Asc diam:  4.20 cm MITRAL VALVE MV Area (PHT): 6.43 cm    SHUNTS MV Decel Time: 118 msec    Systemic VTI:  0.12 m MR Peak grad: 61.3 mmHg    Systemic Diam: 2.50 cm MR Vmax:      391.50 cm/s MV E velocity: 60.80 cm/s MV A velocity: 58.30 cm/s MV E/A ratio:  1.04 Mihai Croitoru MD Electronically signed by Thurmon Fair MD Signature Date/Time: 05/16/2019/4:28:16 PM    Final    VAS [place] LOWER EXTREMITY VENOUS (DVT) (ONLY [facility] & [facility])  Result Date: 05/15/2019  Lower Venous DVTStudy Indications: Edema, and Swelling.  Comparison Study: No priors.  Performing Technologist: Marilynne Halsted RDMS, RVT  Examination Guidelines: A complete evaluation includes B-mode imaging, spectral Doppler, color Doppler, and power Doppler as needed of all accessible portions of each vessel. Bilateral testing is considered an integral part of a complete examination. Limited examinations for reoccurring indications may be performed as noted. The reflux portion of the exam is performed with the patient in reverse Trendelenburg.  +---------+---------------+---------+-----------+----------+--------------+ RIGHT    CompressibilityPhasicitySpontaneityPropertiesThrombus Aging +---------+---------------+---------+-----------+----------+--------------+ CFV      Full           Yes      Yes                                 +---------+---------------+---------+-----------+----------+--------------+ SFJ      Full                                                        +---------+---------------+---------+-----------+----------+--------------+ FV Prox  Full                                                        +---------+---------------+---------+-----------+----------+--------------+  FV Mid   Full                                                        +---------+---------------+---------+-----------+----------+--------------+ FV DistalFull                                                        +---------+---------------+---------+-----------+----------+--------------+ PFV      Full                                                        +---------+---------------+---------+-----------+----------+--------------+ POP      Full           Yes      Yes                                 +---------+---------------+---------+-----------+----------+--------------+ PTV      Full                                                        +---------+---------------+---------+-----------+----------+--------------+ PERO     Full                                                         +---------+---------------+---------+-----------+----------+--------------+   +---------+---------------+---------+-----------+----------+--------------+ LEFT     CompressibilityPhasicitySpontaneityPropertiesThrombus Aging +---------+---------------+---------+-----------+----------+--------------+ CFV      Full           Yes      Yes                                 +---------+---------------+---------+-----------+----------+--------------+ SFJ      Full                                                        +---------+---------------+---------+-----------+----------+--------------+ FV Prox  Full                                                        +---------+---------------+---------+-----------+----------+--------------+ FV Mid   Full                                                        +---------+---------------+---------+-----------+----------+--------------+  FV DistalFull                                                        +---------+---------------+---------+-----------+----------+--------------+ PFV      Full                                                        +---------+---------------+---------+-----------+----------+--------------+ POP      Full           Yes      Yes                                 +---------+---------------+---------+-----------+----------+--------------+ PTV      Full                                                        +---------+---------------+---------+-----------+----------+--------------+ PERO     Full                                                        +---------+---------------+---------+-----------+----------+--------------+     Summary: RIGHT: - There is no evidence of deep vein thrombosis in the lower extremity.  - No cystic structure found in the popliteal fossa.  LEFT: - There is no evidence of deep vein thrombosis in the lower extremity.  - No cystic  structure found in the popliteal fossa.  *See table(s) above for measurements and observations. Electronically signed by Sherald Hess MD on 05/15/2019 at 5:31:09 PM.    Final       Subjective: Patient denies complaints.  Reports that he has been ambulating comfortably in the room.  No dyspnea, chest pain, cough, fever or chills.  Has had a long satisfactory discussion with Cardiology yesterday afternoon where all of his questions were answered to his satisfaction.  Discharge Exam:  Vitals:   05/20/19 0100 05/20/19 0501 05/20/19 0842 05/20/19 1214  BP:  129/80 118/69 (!) 139/91  Pulse:  75 72 71  Resp:  Temp:  98.2 F (36.8 C)  98.7 F (37.1 C)  TempSrc:  Oral  Oral  SpO2:  99% 100% 100%  Weight: 55.1 kg     Height:        General exam: Pleasant young male, moderately built and nourished lying comfortably propped up in bed without distress. Respiratory system: Clear to auscultation.  No increased work of breathing. Cardiovascular system: S1 and S2 heard, RRR. No JVD, murmurs or pedal edema.  Telemetry personally reviewed: Sinus rhythm. Gastrointestinal system: Abdomen is nondistended, soft and nontender. No organomegaly or masses felt. Normal bowel sounds heard. Central nervous system: Alert and oriented. No focal neurological deficits. Extremities: Symmetric 5 x 5 power. Skin: No rashes, lesions or ulcers Psychiatry: Judgement and insight appear normal. Mood &  affect appropriate.     The results of significant diagnostics from this hospitalization (including imaging, microbiology, ancillary and laboratory) are listed below for reference.     Microbiology: Recent Results (from the past 240 hour(s))  Blood culture (routine x 2)     Status: None   Collection Time: 05/15/19  8:13 AM   Specimen: BLOOD  Result Value Ref Range Status   Specimen Description BLOOD RIGHT ANTECUBITAL  Final   Special Requests   Final    BOTTLES DRAWN AEROBIC AND ANAEROBIC Blood Culture  results may not be optimal due to an inadequate volume of blood received in culture bottles   Culture   Final    NO GROWTH 5 DAYS Performed at [facility], [address] [place], [place] [address]    Report Status 05/20/2019 FINAL  Final  Blood culture (routine x 2)     Status: None   Collection Time: 05/15/19  8:18 AM   Specimen: BLOOD  Result Value Ref Range Status   Specimen Description BLOOD SITE NOT SPECIFIED  Final   Special Requests   Final    BOTTLES DRAWN AEROBIC AND ANAEROBIC Blood Culture adequate volume   Culture   Final    NO GROWTH 5 DAYS Performed at [facility], [address] [place], [place] [address]    Report Status 05/20/2019 FINAL  Final  Urine culture     Status: None   Collection Time: 05/15/19  8:56 AM   Specimen: Urine, Random  Result Value Ref Range Status   Specimen Description URINE, RANDOM  Final   Special Requests NONE  Final   Culture   Final    NO GROWTH Performed at [facility], [address] [place], [place] [address]    Report Status 05/16/2019 FINAL  Final  SARS CORONAVIRUS 2 (TAT 6-24 HRS) Nasopharyngeal Nasopharyngeal Swab     Status: None   Collection Time: 05/15/19  1:22 PM   Specimen: Nasopharyngeal Swab  Result Value Ref Range Status   SARS Coronavirus 2 NEGATIVE NEGATIVE Final    Comment: (NOTE) SARS-CoV-2 target nucleic acids are NOT DETECTED. The SARS-CoV-2 RNA is generally detectable in upper and lower respiratory specimens during the acute phase of infection. Negative results do not preclude SARS-CoV-2 infection, do not rule out co-infections with other pathogens, and should not be used as the sole basis for treatment or other patient management decisions. Negative results must be combined with clinical observations, patient history, and epidemiological information. The expected result is Negative. Fact Sheet for Patients: HairSlick.no Fact Sheet for Healthcare  Providers: quierodirigir.com This test is not yet approved or cleared by the [place] FDA and  has been authorized for detection and/or diagnosis of SARS-CoV-2 by FDA under an Emergency Use Authorization (EUA). This EUA will remain  in effect (meaning this test can be used) for the duration of the COVID-19 declaration under Section 56 4(b)(1) of the Act, 21 U.S.C. section 360bbb-3(b)(1), unless the authorization is terminated or revoked sooner. Performed at [facility], [address], [place], [place] [address]   Culture, respiratory     Status: None   Collection Time: 05/15/19  1:22 PM   Specimen: SPU  Result Value Ref Range Status   Specimen Description SPUTUM  Final   Special Requests NONE  Final   Gram Stain   Final    MODERATE WBC PRESENT,BOTH PMN AND MONONUCLEAR MODERATE GRAM POSITIVE COCCI FEW GRAM POSITIVE RODS  Culture   Final    FEW Consistent with normal respiratory flora. Performed at [facility], [place] [address], [place], [place] [address]    Report Status 05/17/2019 FINAL  Final  MRSA PCR Screening     Status: None   Collection Time: 05/16/19 10:40 AM   Specimen: Nasopharyngeal  Result Value Ref Range Status   MRSA by PCR NEGATIVE NEGATIVE Final    Comment:        The GeneXpert MRSA Assay (FDA approved for NASAL specimens only), is one component of a comprehensive MRSA colonization surveillance program. It is not intended to diagnose MRSA infection nor to guide or monitor treatment for MRSA infections. Performed at [facility], [place] [address] [place],  [address]      Labs: CBC: Recent Labs  Lab 05/15/19 [phone] 05/15/19 1229 05/15/19 1413 05/16/19 0438 05/17/19 0348  WBC 7.4  --  7.4 6.3 7.4  NEUTROABS 5.2  --   --   --   --   HGB 10.3*  --  10.7* 9.6* 10.8*  HCT 34.2* 30.8* 35.3* 30.6* 34.7*  MCV 114.0*  --  113.1* 109.7* 108.1*  PLT 220  --  228 224 865    Basic Metabolic  Panel: Recent Labs  Lab 05/15/19 0816 05/15/19 1413 05/16/19 0438 05/17/19 0348 05/18/19 0459 05/19/19 0446 05/20/19 0435  NA   < >  --  142 141 139 140 140  K   < >  --  3.4* 3.1* 3.8 3.6 3.5  CL   < >  --  107 102 103 103 102  CO2   < >  --  25 28 26 25 27   GLUCOSE   < >  --  84 89 84 87 82  BUN   < >  --  6 9 10 12 17   CREATININE   < > 0.76 0.98 1.20 1.32* 1.27* 1.42*  CALCIUM   < >  --  9.1 9.2 9.0 9.0 9.0  MG  --  1.8  --   --   --   --   --   PHOS  --  3.7  --   --   --   --   --    < > = values in this interval not displayed.    Liver Function Tests: Recent Labs  Lab 05/15/19 0816 05/16/19 0438 05/17/19 0348  AST 19 19 16   ALT 11 10 11   ALKPHOS 75 58 66  BILITOT 0.7 1.4* 1.1  PROT 7.4 6.6 7.2  ALBUMIN 2.7* 2.4* 2.5*     Urinalysis    Component Value Date/Time   COLORURINE STRAW (A) 05/15/2019 0856   APPEARANCEUR CLEAR 05/15/2019 0856   LABSPEC 1.004 (L) 05/15/2019 0856   PHURINE 5.0 05/15/2019 0856   GLUCOSEU NEGATIVE 05/15/2019 0856   HGBUR NEGATIVE 05/15/2019 0856   BILIRUBINUR NEGATIVE 05/15/2019 0856   [place] 05/15/2019 0856   PROTEINUR NEGATIVE 05/15/2019 0856   NITRITE NEGATIVE 05/15/2019 0856   LEUKOCYTESUR NEGATIVE 05/15/2019 0856      Time coordinating discharge: 50 minutes  SIGNED:  Vernell Leep, MD, FACP, [facility]. [facility]  To contact the attending provider between 7A-7P or the covering provider during after hours 7P-7A, please log into the web site [email or web address] and access using universal [place] password for that web site. If you do not have the password, please call the hospital operator.

## 2019-05-20 NOTE — Progress Notes (Addendum)
Discharge education and medication education given to patient  with teach back. Education increasing activity slowly, when to call MD, and low sodium heart healthy diet on given. All questions and concerns answered. Peripheral IV and telemetry leads removed. All patient belongings given to patient. Patient transported to main entrance via wheelchair by nurse.

## 2019-05-22 ENCOUNTER — Telehealth: Payer: Self-pay

## 2019-05-22 DIAGNOSIS — Z79899 Other long term (current) drug therapy: Secondary | ICD-10-CM

## 2019-05-22 NOTE — Telephone Encounter (Signed)
-----   Message from Ellsworth Lennox, [place] sent at 05/20/2019  9:58 AM EDT ----- Regarding: BMET in 1 week Good morning,   It appears [place] sent a note to this pool for a TOC call as he has follow-up with [facility] on 4/21. Dr. Mayford Knife also wants him to have a BMET in 1 week. I entered the order for the labs but am typically in [place] so please check to make sure this was entered correctly and remind the patient to have lab work at the time of his [facility] call.  Thanks for your help! Best,  Randall An

## 2019-05-22 NOTE — Telephone Encounter (Signed)
#  1 attempt LMTCB for TOC call and BMET reminder needed approx 05/27/19.. I week after D/C see B Strader PA Note.

## 2019-05-24 NOTE — Telephone Encounter (Signed)
Patient contacted regarding discharge from [facility] on April 10,2021  Patient understands to follow up with provider yes.  Ronie Spies, PA on April 21,2021 at 3:15 at [address] [facility] [address].  Patient understands discharge instructions? yes Patient understands medications and regiment? yes Patient understands to bring all medications to this visit? yes  Ask patient:  Are you enrolled in My Chart -No.  Patient does not want to sign up for my chart.  Patient was discharged on April 10,2021.  Needs BMET in one week.  He will come to [facility] office on April 16 for lab work.

## 2019-05-24 NOTE — Telephone Encounter (Signed)
I briefly spoke with patient. He is not home and reports he cannot write any information down at this time.  He will call us back later today when he gets home.

## 2019-05-26 ENCOUNTER — Other Ambulatory Visit: Payer: Self-pay

## 2019-05-26 ENCOUNTER — Other Ambulatory Visit: Payer: 59 | Admitting: *Deleted

## 2019-05-26 DIAGNOSIS — Z79899 Other long term (current) drug therapy: Secondary | ICD-10-CM

## 2019-05-26 LAB — BASIC METABOLIC PANEL
BUN/Creatinine Ratio: 8 — ABNORMAL LOW (ref 9–20)
BUN: 6 mg/dL (ref 6–24)
CO2: 19 mmol/L — ABNORMAL LOW (ref 20–29)
Calcium: 9.3 mg/dL (ref 8.7–10.2)
Chloride: 108 mmol/L — ABNORMAL HIGH (ref 96–106)
Creatinine, Ser: 0.71 mg/dL — ABNORMAL LOW (ref 0.76–1.27)
GFR calc Af Amer: 120 mL/min/{1.73_m2} (ref >59)
GFR calc non Af Amer: 103 mL/min/{1.73_m2} (ref >59)
Glucose: 77 mg/dL (ref 65–99)
Potassium: 4.2 mmol/L (ref 3.5–5.2)
Sodium: 143 mmol/L (ref 134–144)

## 2019-05-29 ENCOUNTER — Telehealth: Payer: Self-pay | Admitting: *Deleted

## 2019-05-29 NOTE — Telephone Encounter (Signed)
Called patient with test results. No answer. Left message to call back.  

## 2019-05-29 NOTE — Telephone Encounter (Signed)
-----   Message from Ellsworth Lennox, [place] sent at 05/26/2019  5:20 PM EDT ----- (This is a [place] patient I ordered labs on while covering at [employer] -  Please call with results and encourage him to keep scheduled hospital follow-up on 05/31/2019.)  Please let the patient know his renal function has improved since hospital discharge and electrolytes remain within a normal range. Continue with his current medication regimen at this time. Please forward a copy of results to [name], MD.

## 2019-05-30 ENCOUNTER — Encounter: Payer: Self-pay | Admitting: Physician Assistant

## 2019-05-30 NOTE — Progress Notes (Signed)
Cardiology Office Note    Date:  05/31/2019   ID:  Allen Saunders, DOB [birth year], MRN [id number]  PCP:  [name], MD  Cardiologist:  Armanda Magic, MD  Electrophysiologist:  None   Chief Complaint: f/u CHF  History of Present Illness:   Allen Saunders is a 59 y.o. 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 He reports a longstanding history of difficulty swallowing, followed by the [place]. He was admitted earlier in April 2021 with SOB, leg swelling, orthopnea, cough productive of pinkish sputum, chills, weakness, lethargy, night sweats and weight loss. Approximately 6 weeks prior he had been treated as outpatient for CAP. He had a CT during admission showing new large right upper lobe opacity concerning for cavitating pneumonia measuring 5.2 x 3.1 cm with recommendations for follow-up CT scan in 2 to 3 weeks after treatment to ensure resolution and rule out underlying malignancy. He had reported weight loss from 187 to 116lb in 1 year duration with poor oral intake and difficulty swallowing. Troponins were low/flat. He was found to have new onset CHF with EF 35-40%, low-normal RV function, mild MR/AI, moderate dilation of aortic root/ascending aorta at 42mm. He underwent nuclear stress test which was negative for ischemia and felt to be low risk. He was diuresed and medications optimized. TSH was normal. 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 Last labs personally reviewed from 05/2019 include K 4.2, Cr 0.71, chloride 108, Co2 19, Hgb 10.8, albumin 2.5 otherwise LFTs ok, quantiferon (TB) negative, LDL 87, TSH wnl.  He returns for follow-up today feeling OK. He  is getting his strength back daily but is still quite weak. He reports [facility] He is not clear on which ones he had been out of until a few days ago. It is challenging to follow exactly what he was out of. As a result, he has not had several of his medications including cardiac ones consistently. He also has not taken any of his heart medication yet today. His Augmentin bottle was prescribed 4/10, with rx instructions to take for 9 days but the bottle is still quite full. This is one of the ones he says [facility] not [facility]. He reports the [place] also told him to take erythromycin but that is not in his medication bag. 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 He reports his legs remain significantly less edematous than they were in the hospital, but still with some pitting presence. Breathing is stable. No CP.    Past Medical History:  Diagnosis Date  . Anxiety   . Aortic root aneurysm (HCC)   . Chronic systolic CHF (congestive heart failure) (HCC)   . Hypertension   . 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 Lung mass   . NICM (nonischemic cardiomyopathy) (HCC)    a. presumed - negative nuc 05/2019, felt due to ETOH.  . Panic attack   . Substance abuse (HCC)    ETOH  . Thoracic aortic aneurysm, without rupture (HCC)    NOTED 06/09/2016 on ECHO  . Unintentional weight loss     Past Surgical History:  Procedure  Laterality Date  . COLONOSCOPY  11/13/2013   h/o diarrhea, diverticula, large hemorrhoids  . KNEE ARTHROSCOPY W/ MENISCAL REPAIR Left    X2 in the 80's  . KNEE ARTHROSCOPY W/ MENISCAL REPAIR Right    x 2 in the 80's  . LEFT HEART CATHETERIZATION WITH CORONARY ANGIOGRAM N/A 10/03/2011   Procedure: LEFT HEART CATHETERIZATION WITH CORONARY ANGIOGRAM;  Surgeon: [name], MD;  Location: [facility] CATH LAB;  Service: Cardiovascular;  Laterality: N/A;    Current Medications: Current Meds    Medication Sig  . amoxicillin-clavulanate (AUGMENTIN) 875-125 MG tablet Take 1 tablet by mouth 2 (two) times daily.  Marland Kitchen atorvastatin (LIPITOR) 20 MG tablet Take 1 tablet (20 mg total) by mouth daily.  . carvedilol (COREG) 6.25 MG tablet Take 1 tablet (6.25 mg total) by mouth 2 (two) times daily with a meal.  . cholecalciferol (VITAMIN D3) 25 MCG (1000 UNIT) tablet Take 1,000 Units by mouth daily.  . feeding supplement, ENSURE ENLIVE, (ENSURE ENLIVE) LIQD Take 237 mLs by mouth 3 (three) times daily between meals.  . folic acid (FOLVITE) 1 MG tablet Take 1 tablet (1 mg total) by mouth daily.  . furosemide (LASIX) 20 MG tablet Take 1 tablet (20 mg total) by mouth daily.  . Lidocaine (HM LIDOCAINE PATCH) 4 % PTCH Apply 1 patch topically daily as needed (Back pain).  . Multiple Vitamin (MULTIVITAMIN WITH MINERALS) TABS tablet Take 1 tablet by mouth daily.  . sacubitril-valsartan (ENTRESTO) 24-26 MG Take 1 tablet by mouth 2 (two) times daily.  Marland Kitchen thiamine (VITAMIN B-1) 50 MG tablet Take 50 mg by mouth 2 (two) times daily.  . vitamin B-12 1000 MCG tablet Take 1 tablet (1,000 mcg total) by mouth daily.      Allergies:   Asa [aspirin]   Social History   Socioeconomic History  . Marital status: Married    Spouse name: Not on file  . Number of children: Not on file  . Years of education: Not on file  . Highest education level: Not on file  Occupational History  . Not on file  Tobacco Use  . Smoking status: Current Every Day Smoker    Packs/day: 1.00    Types: Cigarettes    Start date: 02/09/1978  . Smokeless tobacco: Never Used  Substance and Sexual Activity  . Alcohol use: Yes    Comment: 12 216 oz beers per day  . Drug use: No  . Sexual activity: Yes  Other Topics Concern  . Not on file  Social History Narrative  . Not on file   Social Determinants of Health   Financial Resource Strain:   . Difficulty of Paying Living Expenses:   Food Insecurity:   . Worried About Brewing technologist in the Last Year:   . Barista in the Last Year:   Transportation Needs:   . Freight forwarder (Medical):   Marland Kitchen Lack of Transportation (Non-Medical):   Physical Activity:   . Days of Exercise per Week:   . Minutes of Exercise per Session:   Stress:   . Feeling of Stress :   Social Connections:   . Frequency of Communication with Friends and Family:   . Frequency of Social Gatherings with Friends and Family:   . Attends Religious Services:   . Active Member of Clubs or Organizations:   . Attends Banker Meetings:   Marland Kitchen Marital Status:      Family History:  The patient's  family history includes Coronary artery disease in his father.  ROS:   Please see the history of present illness. Otherwise, review of systems is positive for fatigue All other systems are reviewed and otherwise negative.    EKGs/Labs/Other Studies Reviewed:    Studies reviewed are outlined and summarized above. Reports included below if pertinent.  2D echo 05/2019 IMPRESSIONS  1. Left ventricular ejection fraction, by estimation, is 35 to 40%. The  left ventricle has moderately decreased function. The left ventricle  demonstrates global hypokinesis. Indeterminate diastolic filling due to  E-A fusion.  2. Right ventricular systolic function is low normal. The right  ventricular size is normal. Tricuspid regurgitation signal is inadequate  for assessing PA pressure.  3. The mitral valve is normal in structure. Mild mitral valve  regurgitation.  4. The aortic valve is tricuspid. Aortic valve regurgitation is mild. No  aortic stenosis is present.  5. Aortic dilatation noted. There is moderate dilatation of the aortic  root and of the ascending aorta measuring 46 mm.  6. The inferior vena cava is normal in size with greater than 50%  respiratory variability, suggesting right atrial pressure of 3 mmHg.  NST 05/2019 IMPRESSION: 1. No reversible ischemia or infarction 2.  Hypokinesia of the septal wall. 3. Left ventricular ejection fraction 38% 4. Non invasive risk stratification*: Intermediate  CT Angio 05/2019 IMPRESSION: 1. No definite evidence of pulmonary embolus. 2. Small bilateral pleural effusions are noted with mild bibasilar subsegmental atelectasis. 3. Large right upper lobe opacity is noted concerning for cavitating pneumonia. Follow-up CT scan in 2-3 weeks after treatment is recommended to ensure resolution and rule out underlying malignancy. 4. 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 Ascending thoracic aortic aneurysm. Recommend semi-annual imaging followup by CTA or MRA and referral to cardiothoracic surgery if not already obtained. This recommendation follows 2010 ACCF/AHA/AATS/ACR/ASA/SCA/SCAI/SIR/STS/SVM Guidelines for the Diagnosis and Management of Patients With Thoracic Aortic Disease. Circulation. 2010; 121: P102-H852. Aortic aneurysm NOS (ICD10-I71.9). 5. Aortic root is aneurysmal measuring 5.1 cm in diameter.    EKG:  EKG is not ordered tody  Recent Labs: 05/15/2019: B Natriuretic Peptide 2,123.1; Magnesium 1.8; TSH 2.063 05/17/2019: ALT 11; Hemoglobin 10.8; Platelets 245 05/26/2019: BUN 6; Creatinine, Ser 0.71; Potassium 4.2; Sodium 143  Recent Lipid Panel    Component Value Date/Time   CHOL 176 05/15/2019 1414   TRIG 77 05/15/2019 1414   HDL 74 05/15/2019 1414   CHOLHDL 2.4 05/15/2019 1414   VLDL 15 05/15/2019 1414   LDLCALC 87 05/15/2019 1414    PHYSICAL EXAM:    VS:  BP 138/90 (BP Location: Right Arm, Patient Position: Sitting, Cuff Size: Small)   Pulse 96   Ht 6\' 5"  (1.956 m)   Wt 126 lb 6.4 oz (57.3 kg)   SpO2 99%   BMI 14.99 kg/m   BMI: Body mass index is 14.99 kg/m.  GEN: Very thin AAM in no acute distress HEENT: normocephalic, atraumatic Neck: no JVD, carotid bruits, or masses Cardiac: RRR; no murmurs, rubs, or gallops, trace-1+ pitting edema superimposed on cachectic appearing  limbs Respiratory:  clear to auscultation bilaterally, normal work of breathing GI: soft, nontender, nondistended, + BS MS: no deformity or atrophy Skin: warm and dry, no rash Neuro:  Alert and Oriented x 3, Strength and sensation are intact, follows commands Psych: euthymic mood, full affect  Wt Readings from Last 3 Encounters:  05/31/19 126 lb 6.4 oz (57.3 kg)  05/20/19 121 lb 8 oz (55.1 kg)  12/07/18  120 lb (54.4 kg)     ASSESSMENT & PLAN:   1. Chronic systolic CHF/recently diagnosed NICM - clinically improved but still with pitting edema on board. His weight is also up slightly from discharge but he is also now wearing clothing and shoes. I suspect he does have some fluid accumulation left on exam. However, it is challenging to know how to approach his regimen as he has not been taking the prescribed regimen consistently yet, citing [facility] He now has all his cardiac medications in his possession so will have him take the regimen as outlined and follow volume status and blood pressure. Will arrange recheck visit in 1 week. Reviewed 2g sodium restriction, 2L fluid restriction, daily weights with patient. I did broach issue of AHF clinic/telemedicine program with the patient but he politely declined at this time and wishes to try to get on track on his own at home. We discussed utilizing a pill box to make dosing easier. Also discussed importance of long term reduction in ETOH. 2. Lung mass - [facility] He has not been taking his antibiotic as prescribed based on the pills left in the bottle. He also indicates he had been told to take erythromycin by the [place]. I cannot see those records so it's not clear to me where that recommendation came from. I asked [facility] to contact [facility] to discuss [facility] for [facility] since he will be finishing out Augmentin. Given that his PNA may be incompletely treated, would recommend he discuss CT scan at his  f/u appointment with the [place]. 3. Ascending thoracic aortic aneurysm and aortic root aneurysm - chronic finding for patient, last saw Dr. Prescott Gum in 11/2018 with recommendation for 1 year follow-up. CT 05/15/19 suggested semi-annual follow-up. Will encourage him to follow up as recommended as his 6 month scan will be due around the time he will see Dr. Prescott Gum. See below regarding blood pressure. 4. Essential HTN - BP mildly elevated but as above, sporadic dosing of medications. He has not taken any medications yet today. Encouraged consistent dosing of medications as prescribed - we will follow up closely in 1 week for recheck blood pressure to re-evaluate what this looks like when he has taken the regimen as outlined. Will also reach out to social worker to see if there are any options to help him getting a blood pressure cuff. 5. Anemia - recommend further follow-up with primary care for monitoring. He is now on B vitamin supplementation and trying to drink Ensure.  Disposition: F/u with myself in 1 week for close f/u. Warning sx reviewed with patient.  Medication Adjustments/Labs and Tests Ordered: Current medicines are reviewed at length with the patient today.  Concerns regarding medicines are outlined above. Medication changes, Labs and Tests ordered today are summarized above and listed in the Patient Instructions accessible in Encounters.   Signed, Charlie Pitter, PA-C  05/31/2019 4:08 PM    [facility] [place], [place], [place]  [address] Phone: [phone]; Fax: [phone]

## 2019-05-31 ENCOUNTER — Other Ambulatory Visit: Payer: Self-pay

## 2019-05-31 ENCOUNTER — Ambulatory Visit (INDEPENDENT_AMBULATORY_CARE_PROVIDER_SITE_OTHER): Payer: 59 | Admitting: Physician Assistant

## 2019-05-31 ENCOUNTER — Encounter: Payer: Self-pay | Admitting: Physician Assistant

## 2019-05-31 VITALS — BP 138/90 | HR 96 | Ht 77.0 in | Wt 126.4 lb

## 2019-05-31 DIAGNOSIS — I712 Thoracic aortic aneurysm, without rupture: Secondary | ICD-10-CM

## 2019-05-31 DIAGNOSIS — D649 Anemia, unspecified: Secondary | ICD-10-CM

## 2019-05-31 DIAGNOSIS — I5022 Chronic systolic (congestive) heart failure: Secondary | ICD-10-CM | POA: Diagnosis not present

## 2019-05-31 DIAGNOSIS — R918 Other nonspecific abnormal finding of lung field: Secondary | ICD-10-CM | POA: Diagnosis not present

## 2019-05-31 DIAGNOSIS — I428 Other cardiomyopathies: Secondary | ICD-10-CM

## 2019-05-31 DIAGNOSIS — I719 Aortic aneurysm of unspecified site, without rupture: Secondary | ICD-10-CM

## 2019-05-31 DIAGNOSIS — I7121 Aneurysm of the ascending aorta, without rupture: Secondary | ICD-10-CM

## 2019-05-31 DIAGNOSIS — I10 Essential (primary) hypertension: Secondary | ICD-10-CM

## 2019-05-31 NOTE — Patient Instructions (Addendum)
Medication Instructions:  Your physician recommends that you continue on your current medications as directed. Please refer to the Current Medication list given to you today.  *If you need a refill on your cardiac medications before your next appointment, please call your pharmacy*   Lab Work: TODAY:   If you have labs (blood work) drawn today and your tests are completely normal, you will receive your results only by: Marland Kitchen MyChart Message (if you have MyChart) OR . A paper copy in the mail If you have any lab test that is abnormal or we need to change your treatment, we will call you to review the results.   Testing/Procedures: None ordered    Follow-Up: At [facility], you and your health needs are our priority.  As part of our continuing mission to provide you with exceptional heart care, we have created designated Provider Care Teams.  These Care Teams include your primary Cardiologist (physician) and Advanced Practice Providers (APPs -  Physician Assistants and Nurse Practitioners) who all work together to provide you with the care you need, when you need it.  We recommend signing up for the patient portal called "MyChart".  Sign up information is provided on this After Visit Summary.  MyChart is used to connect with patients for Virtual Visits (Telemedicine).  Patients are able to view lab/test results, encounter notes, upcoming appointments, etc.  Non-urgent messages can be sent to your provider as well.   To learn more about what you can do with MyChart, go to [email or web address].    Your next appointment:   1 WEEK   06/08/2019 ARRIVE AT 3:00 FOR REGISTRATION  The format for your next appointment:   In Person  Provider:   You may see Armanda Magic, MD or one of the following Advanced Practice Providers on your designated Care Team:    Ronie Spies, PA-C  Jacolyn Reedy, PA-C    Other Instructions  You will be due to follow-up with Dr. Alla German for follow-up of your  aneurysm with CT Surgery, in October.  Just a reminder.  Please get a blood pressure cuff that goes on your arm. The wrist ones can be inaccurate. If possible, try to select one that also reports your heart rate. To check your blood pressure, choose a time at least 3 hours after taking your blood pressure medicines. If you want to check it at different times of the day, that's okay too - it might give you more information about how your blood pressure fluctuates. Remain seated in a chair for 5 minutes quietly beforehand, then check it. Please keep a log and bring to your next appointment if possible.  Please contact your primary care provider at the [facility] to discuss your antibiotic regimen since you have not been taking the Augmentin prescribed by the hospital in full yet. You should also discuss timing of the repeat CT scan recommended from the hospital.

## 2019-06-01 ENCOUNTER — Telehealth: Payer: Self-pay | Admitting: Licensed Clinical Social Worker

## 2019-06-01 NOTE — Telephone Encounter (Signed)
CSW referred to assist patient with obtaining a BP cuff. CSW contacted patient to inform cuff will be delivered to home. Patient grateful for support and assistance. CSW available as needed. Jackie Lexianna Weinrich, LCSW, CCSW-MCS [phone]  

## 2019-06-08 ENCOUNTER — Ambulatory Visit: Payer: 59 | Admitting: Physician Assistant

## 2019-06-08 NOTE — Progress Notes (Deleted)
Cardiology Office Note    Date:  06/08/2019   ID:  Mubarak Bevens, DOB 1960/07/02, MRN [id number]  PCP:  [name], MD  Cardiologist:  Armanda Magic, MD  Electrophysiologist:  None   Chief Complaint: f/u CHF  History of Present Illness:   Allen Saunders is a 59 y.o. 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 He reports a longstanding history of difficulty swallowing, followed by the [place]. He was admitted earlier in April 2021 with SOB, leg swelling, orthopnea, cough productive of pinkish sputum, chills, weakness, lethargy, night sweats and weight loss. Approximately 6 weeks prior he had been treated as outpatient for CAP. He had a CT during admission showing new large right upper lobe opacity concerning for cavitating pneumonia measuring 5.2 x 3.1 cm with recommendations for follow-up CT scan in 2 to 3 weeks after treatment to ensure resolution and rule out underlying malignancy. He had reported weight loss from 187 to 116lb in 1 year duration with poor oral intake and difficulty swallowing. Troponins were low/flat. He was found to have new onset CHF with EF 35-40%, low-normal RV function, mild MR/AI, moderate dilation of aortic root/ascending aorta at 74mm. He underwent nuclear stress test which was negative for ischemia and felt to be low risk. He was diuresed and medications optimized. TSH was normal. 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 Last labs personally reviewed from 05/2019 include K 4.2, Cr 0.71, chloride 108, Co2 19, Hgb 10.8, albumin 2.5 otherwise LFTs ok, quantiferon (TB) negative, LDL 87, TSH wnl. I saw him back for follow-up 4/21 with some  continued edema and weakness. His blood pressure was mildly elevated. He had reported some difficulty picking up his medications so had not started them until a few days before the visit, unclear which ones. His Augmentin bottle was prescribed 4/10, with rx instructions to take for 9 days but the bottle is still quite full.  Lung mass  Chronic systolic CHF/NICM Lung mass Ascending thoracic aortic aneurysm and aortic root aneurysm Essential HTN Anemia   Past Medical History:  Diagnosis Date  . Anxiety   . Aortic root aneurysm (HCC)   . Chronic systolic CHF (congestive heart failure) (HCC)   . Hypertension   . 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 Lung mass   . NICM (nonischemic cardiomyopathy) (HCC)    a. presumed - negative nuc 05/2019, felt due to ETOH.  . Panic attack   . Substance abuse (HCC)    ETOH  . Thoracic aortic aneurysm, without rupture (HCC)    NOTED 06/09/2016 on ECHO  . Unintentional weight loss     Past Surgical History:  Procedure Laterality Date  . COLONOSCOPY  11/13/2013   h/o diarrhea, diverticula, large hemorrhoids  . KNEE ARTHROSCOPY W/ MENISCAL REPAIR Left    X2 in the 80's  . KNEE ARTHROSCOPY W/ MENISCAL REPAIR Right    x 2 in the 80's  . LEFT HEART CATHETERIZATION WITH CORONARY ANGIOGRAM N/A 10/03/2011   Procedure: LEFT HEART CATHETERIZATION WITH CORONARY ANGIOGRAM;  Surgeon: [name], MD;  Location: [facility] CATH LAB;  Service: Cardiovascular;  Laterality: N/A;    Current Medications: No outpatient medications have been marked as taking for  the 06/08/19 encounter (Appointment) with [name], PA-C.   ***   Allergies:   Jonne Ply [aspirin]   Social History   Socioeconomic History  . Marital status: Married    Spouse name: Not on file  . Number of children: Not on file  . Years of education: Not on file  . Highest education level: Not on file  Occupational History  . Not on file  Tobacco Use  . Smoking status: Current Every Day Smoker    Packs/day:  1.00    Types: Cigarettes    Start date: 02/09/1978  . Smokeless tobacco: Never Used  Substance and Sexual Activity  . Alcohol use: Yes    Comment: 12 216 oz beers per day  . Drug use: No  . Sexual activity: Yes  Other Topics Concern  . Not on file  Social History Narrative  . Not on file   Social Determinants of Health   Financial Resource Strain:   . Difficulty of Paying Living Expenses:   Food Insecurity:   . Worried About Programme researcher, broadcasting/film/video in the Last Year:   . Barista in the Last Year:   Transportation Needs:   . Freight forwarder (Medical):   Marland Kitchen Lack of Transportation (Non-Medical):   Physical Activity:   . Days of Exercise per Week:   . Minutes of Exercise per Session:   Stress:   . Feeling of Stress :   Social Connections:   . Frequency of Communication with Friends and Family:   . Frequency of Social Gatherings with Friends and Family:   . Attends Religious Services:   . Active Member of Clubs or Organizations:   . Attends Banker Meetings:   Marland Kitchen Marital Status:      Family History:  The patient's ***family history includes Coronary artery disease in his father.  ROS:   Please see the history of present illness. Otherwise, review of systems is positive for ***.  All other systems are reviewed and otherwise negative.    EKGs/Labs/Other Studies Reviewed:    Studies reviewed are outlined and summarized above. Reports included below if pertinent.  2D echo 05/2019 IMPRESSIONS  1. Left ventricular ejection fraction, by estimation, is 35 to 40%. The  left ventricle has moderately decreased function. The left ventricle  demonstrates global hypokinesis. Indeterminate diastolic filling due to  E-A fusion.  2. Right ventricular systolic function is low normal. The right  ventricular size is normal. Tricuspid regurgitation signal is inadequate  for assessing PA pressure.  3. The mitral valve is normal in structure. Mild mitral valve    regurgitation.  4. The aortic valve is tricuspid. Aortic valve regurgitation is mild. No  aortic stenosis is present.  5. Aortic dilatation noted. There is moderate dilatation of the aortic  root and of the ascending aorta measuring 46 mm.  6. The inferior vena cava is normal in size with greater than 50%  respiratory variability, suggesting right atrial pressure of 3 mmHg.  NST 05/2019 IMPRESSION: 1. No reversible ischemia or infarction 2. Hypokinesia of the septal wall. 3. Left ventricular ejection fraction 38% 4. Non invasive risk stratification*: Intermediate  CT Angio 05/2019 IMPRESSION: 1. No definite evidence of pulmonary embolus. 2. Small bilateral pleural effusions are noted with mild bibasilar subsegmental atelectasis. 3. Large right upper lobe opacity is noted concerning for cavitating pneumonia. Follow-up CT scan in 2-3 weeks after treatment is recommended to ensure resolution and rule out underlying malignancy. 4. 4.5  cm ascending thoracic aortic aneurysm is noted. Ascending thoracic aortic aneurysm. Recommend semi-annual imaging followup by CTA or MRA and referral to cardiothoracic surgery if not already obtained. This recommendation follows 2010 ACCF/AHA/AATS/ACR/ASA/SCA/SCAI/SIR/STS/SVM Guidelines for the Diagnosis and Management of Patients With Thoracic Aortic Disease. Circulation. 2010; 121: P536-R443. Aortic aneurysm NOS (ICD10-I71.9). 5. Aortic root is aneurysmal measuring 5.1 cm in diameter.    EKG:  EKG is ordered today, personally reviewed, demonstrating ***  Recent Labs: 05/15/2019: B Natriuretic Peptide 2,123.1; Magnesium 1.8; TSH 2.063 05/17/2019: ALT 11; Hemoglobin 10.8; Platelets 245 05/26/2019: BUN 6; Creatinine, Ser 0.71; Potassium 4.2; Sodium 143  Recent Lipid Panel    Component Value Date/Time   CHOL 176 05/15/2019 1414   TRIG 77 05/15/2019 1414   HDL 74 05/15/2019 1414   CHOLHDL 2.4 05/15/2019 1414   VLDL 15 05/15/2019 1414   LDLCALC  87 05/15/2019 1414    PHYSICAL EXAM:    VS:  There were no vitals taken for this visit.  BMI: There is no height or weight on file to calculate BMI.  GEN: Well nourished, well developed, in no acute distress HEENT: normocephalic, atraumatic Neck: no JVD, carotid bruits, or masses Cardiac: ***RRR; no murmurs, rubs, or gallops, no edema  Respiratory:  clear to auscultation bilaterally, normal work of breathing GI: soft, nontender, nondistended, + BS MS: no deformity or atrophy Skin: warm and dry, no rash Neuro:  Alert and Oriented x 3, Strength and sensation are intact, follows commands Psych: euthymic mood, full affect  Wt Readings from Last 3 Encounters:  05/31/19 126 lb 6.4 oz (57.3 kg)  05/20/19 121 lb 8 oz (55.1 kg)  12/07/18 120 lb (54.4 kg)     ASSESSMENT & PLAN:   1. ***  Disposition: F/u with ***   Medication Adjustments/Labs and Tests Ordered: Current medicines are reviewed at length with the patient today.  Concerns regarding medicines are outlined above. Medication changes, Labs and Tests ordered today are summarized above and listed in the Patient Instructions accessible in Encounters.   Signed, Charlie Pitter, PA-C  06/08/2019 8:06 AM    [address], [place], [place]  [address] Phone: [phone]; Fax: [phone]

## 2019-07-10 NOTE — Progress Notes (Deleted)
Cardiology Office Note    Date:  07/10/2019   ID:  Allen Saunders, DOB [birth year], MRN [id number]  PCP:  [name], MD  Cardiologist:  Armanda Magic, MD  Electrophysiologist:  None   Chief Complaint: f/u CHF  History of Present Illness:   Allen Saunders is a 59 y.o. 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 He reports a longstanding history of difficulty swallowing followed by the [place]. He was admitted in April 2021 with SOB, leg swelling, orthopnea, cough productive of pinkish sputum, chills, weakness, lethargy, night sweats and weight loss. Approximately 6 weeks prior he had been treated as outpatient for CAP. He had a CT during admission showing new large right upper lobe opacity concerning for cavitating pneumonia measuring 5.2 x 3.1 cm with recommendations for follow-up CT scan in 2 to 3 weeks after treatment to ensure resolution and rule out underlying malignancy. He had reported weight loss from 187 to 116lb in 1 year duration with poor oral intake and difficulty swallowing. Troponins were low/flat. He was found to have new onset CHF with EF 35-40%, low-normal RV function, mild MR/AI, moderate dilation of aortic root/ascending aorta at 39mm. He underwent nuclear stress test which was negative for ischemia and felt to be low risk. He was diuresed and medications optimized. TSH was normal. 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 Last labs personally reviewed from 05/2019 include K 4.2, Cr 0.71, chloride 108, CO2 19, Hgb 10.8, albumin 2.5 otherwise LFTs ok, quantiferon (TB) negative, LDL 87, TSH wnl. I saw him back for follow-up 4/21 with some continued  edema and weakness. His blood pressure was mildly elevated. He had reported some difficulty picking up his medications so had not started them until a few days before the visit - patient was unclear on which ones. His Augmentin bottle was still quite full despite this being something that should've been completed. Close f/u in 1 week was arranged but he postponed this appointment.  Lung mass   Chronic systolic CHF/NICM  Lung mass  Ascending thoracic aortic aneurysm and aortic root aneurysm  Essential HTN  Anemia     Past Medical History:  Diagnosis Date  . Anxiety   . Aortic root aneurysm (HCC)   . Chronic systolic CHF (congestive heart failure) (HCC)   . Hypertension   . 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 Lung mass   . NICM (nonischemic cardiomyopathy) (HCC)    a. presumed - negative nuc 05/2019, felt due to ETOH.  . Panic attack   . Substance abuse (HCC)    ETOH  . Thoracic aortic aneurysm, without rupture (HCC)    NOTED 06/09/2016 on ECHO  . Unintentional weight loss     Past Surgical History:  Procedure Laterality Date  . COLONOSCOPY  11/13/2013   h/o diarrhea, diverticula, large hemorrhoids  . KNEE ARTHROSCOPY W/ MENISCAL REPAIR Left    X2 in the 80's  . KNEE ARTHROSCOPY W/ MENISCAL REPAIR Right    x 2 in the 80's  . LEFT HEART CATHETERIZATION WITH CORONARY ANGIOGRAM N/A 10/03/2011   Procedure: LEFT HEART CATHETERIZATION WITH CORONARY ANGIOGRAM;  Surgeon: [name], MD;  Location: [facility] CATH LAB;  Service: Cardiovascular;  Laterality: N/A;    Current Medications: No outpatient medications have been marked as taking for the 07/12/19 encounter (Appointment) with Charlie Pitter, PA-C.   ***   Allergies:   Diona Fanti [aspirin]   Social History   Socioeconomic History  . Marital status: Married    Spouse name: Not on file  . Number of children: Not on file  . Years of education: Not on file  . Highest education level: Not on file  Occupational History  . Not on file  Tobacco  Use  . Smoking status: Current Every Day Smoker    Packs/day: 1.00    Types: Cigarettes    Start date: 02/09/1978  . Smokeless tobacco: Never Used  Substance and Sexual Activity  . Alcohol use: Yes    Comment: 12 216 oz beers per day  . Drug use: No  . Sexual activity: Yes  Other Topics Concern  . Not on file  Social History Narrative  . Not on file   Social Determinants of Health   Financial Resource Strain:   . Difficulty of Paying Living Expenses:   Food Insecurity:   . Worried About Charity fundraiser in the Last Year:   . Arboriculturist in the Last Year:   Transportation Needs:   . Film/video editor (Medical):   Marland Kitchen Lack of Transportation (Non-Medical):   Physical Activity:   . Days of Exercise per Week:   . Minutes of Exercise per Session:   Stress:   . Feeling of Stress :   Social Connections:   . Frequency of Communication with Friends and Family:   . Frequency of Social Gatherings with Friends and Family:   . Attends Religious Services:   . Active Member of Clubs or Organizations:   . Attends Archivist Meetings:   Marland Kitchen Marital Status:      Family History:  The patient's ***family history includes Coronary artery disease in his father.  ROS:   Please see the history of present illness. Otherwise, review of systems is positive for ***.  All other systems are reviewed and otherwise negative.    EKGs/Labs/Other Studies Reviewed:    Studies reviewed are outlined and summarized above. Reports included below if pertinent.  2D echo 05/2019  IMPRESSIONS  1. Left ventricular ejection fraction, by estimation, is 35 to 40%. The  left ventricle has moderately decreased function. The left ventricle  demonstrates global hypokinesis. Indeterminate diastolic filling due to  E-A fusion.  2. Right ventricular systolic function is low normal. The right  ventricular size is normal. Tricuspid regurgitation signal is inadequate  for assessing PA pressure.    3. The mitral valve is normal in structure. Mild mitral valve  regurgitation.  4. The aortic valve is tricuspid. Aortic valve regurgitation is mild. No  aortic stenosis is present.  5. Aortic dilatation noted. There is moderate dilatation of the aortic  root and of the ascending aorta measuring 46 mm.  6. The inferior vena cava is normal in size with greater than 50%  respiratory variability, suggesting right atrial pressure of 3 mmHg.    NST 05/2019  IMPRESSION:  1. No reversible ischemia or infarction  2. Hypokinesia of the septal wall.  3. Left ventricular ejection fraction 38%  4. Non invasive risk stratification*: Intermediate    CT Angio 05/2019  IMPRESSION:  1. No definite evidence of pulmonary embolus.  2. Small bilateral pleural effusions are noted with mild bibasilar  subsegmental atelectasis.  3.  Large right upper lobe opacity is noted concerning for cavitating  pneumonia. Follow-up CT scan in 2-3 weeks after treatment is  recommended to ensure resolution and rule out underlying malignancy.  4. 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 Ascending  thoracic aortic aneurysm. Recommend semi-annual imaging followup by  CTA or MRA and referral to cardiothoracic surgery if not already  obtained. This recommendation follows 2010  ACCF/AHA/AATS/ACR/ASA/SCA/SCAI/SIR/STS/SVM Guidelines for the  Diagnosis and Management of Patients With Thoracic Aortic Disease.  Circulation. 2010; 121: C623-J628. Aortic aneurysm NOS  (ICD10-I71.9).  5. Aortic root is aneurysmal measuring 5.1 cm in diameter.      EKG:  EKG is ordered today, personally reviewed, demonstrating ***  Recent Labs: 05/15/2019: B Natriuretic Peptide 2,123.1; Magnesium 1.8; TSH 2.063 05/17/2019: ALT 11; Hemoglobin 10.8; Platelets 245 05/26/2019: BUN 6; Creatinine, Ser 0.71; Potassium 4.2; Sodium 143  Recent Lipid Panel    Component Value Date/Time   CHOL 176 05/15/2019 1414   TRIG 77 05/15/2019 1414    HDL 74 05/15/2019 1414   CHOLHDL 2.4 05/15/2019 1414   VLDL 15 05/15/2019 1414   LDLCALC 87 05/15/2019 1414    PHYSICAL EXAM:    VS:  There were no vitals taken for this visit.  BMI: There is no height or weight on file to calculate BMI.  GEN: Well nourished, well developed, in no acute distress HEENT: normocephalic, atraumatic Neck: no JVD, carotid bruits, or masses Cardiac: ***RRR; no murmurs, rubs, or gallops, no edema  Respiratory:  clear to auscultation bilaterally, normal work of breathing GI: soft, nontender, nondistended, + BS MS: no deformity or atrophy Skin: warm and dry, no rash Neuro:  Alert and Oriented x 3, Strength and sensation are intact, follows commands Psych: euthymic mood, full affect  Wt Readings from Last 3 Encounters:  05/31/19 126 lb 6.4 oz (57.3 kg)  05/20/19 121 lb 8 oz (55.1 kg)  12/07/18 120 lb (54.4 kg)     ASSESSMENT & PLAN:   1. ***  Disposition: F/u with ***   Medication Adjustments/Labs and Tests Ordered: Current medicines are reviewed at length with the patient today.  Concerns regarding medicines are outlined above. Medication changes, Labs and Tests ordered today are summarized above and listed in the Patient Instructions accessible in Encounters.   Signed, [name], PA-C  07/10/2019 3:02 PM    [facility] [address] [place], [place], [place]  [address] Phone: [phone]; Fax: [phone]

## 2019-07-12 ENCOUNTER — Ambulatory Visit: Payer: 59 | Admitting: Physician Assistant

## 2019-10-30 ENCOUNTER — Other Ambulatory Visit: Payer: Self-pay | Admitting: *Deleted

## 2019-10-30 DIAGNOSIS — I712 Thoracic aortic aneurysm, without rupture, unspecified: Secondary | ICD-10-CM

## 2019-12-06 ENCOUNTER — Inpatient Hospital Stay: Admission: RE | Admit: 2019-12-06 | Payer: 59 | Source: Ambulatory Visit

## 2019-12-06 ENCOUNTER — Ambulatory Visit: Payer: 59 | Admitting: Cardiothoracic Surgery

## 2019-12-07 ENCOUNTER — Encounter: Payer: Self-pay | Admitting: Cardiothoracic Surgery

## 2020-11-02 IMAGING — CT CT ANGIO CHEST
3 of 7 series · 18 of 36 positions shown · IV contrast (omnipaque)
Comparison: December 07, 2018.

CLINICAL DATA: Shortness of breath.

EXAM:
CT ANGIOGRAPHY CHEST WITH CONTRAST
TECHNIQUE: Multidetector CT imaging of the chest was performed using the
standard protocol during bolus administration of intravenous
contrast. Multiplanar CT image reconstructions and MIPs were
obtained to evaluate the vascular anatomy.
CONTRAST:  75mL OMNIPAQUE IOHEXOL 350 MG/ML SOLN

[Series 6: pe thins · axial · 0.80mm/px · z∈[+1243,+1588]mm · 15 of 564 slices shown]
[im 36/564  lung]
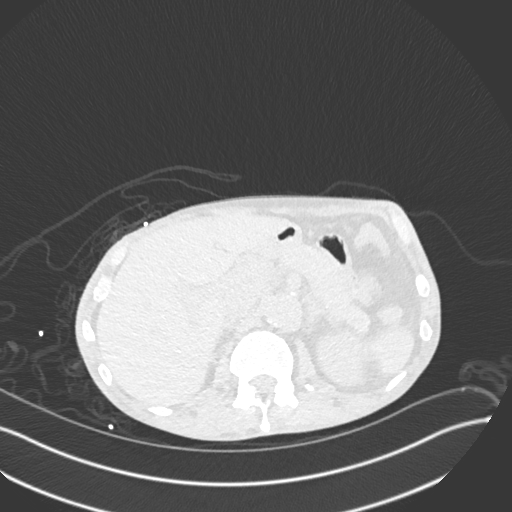
[im 71/564  mediastinal]
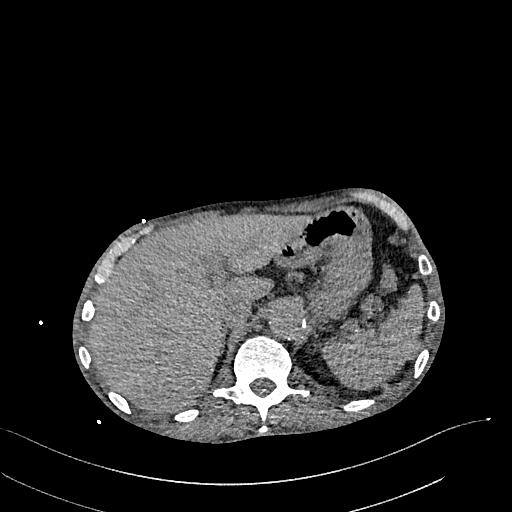
[im 106/564  lung]
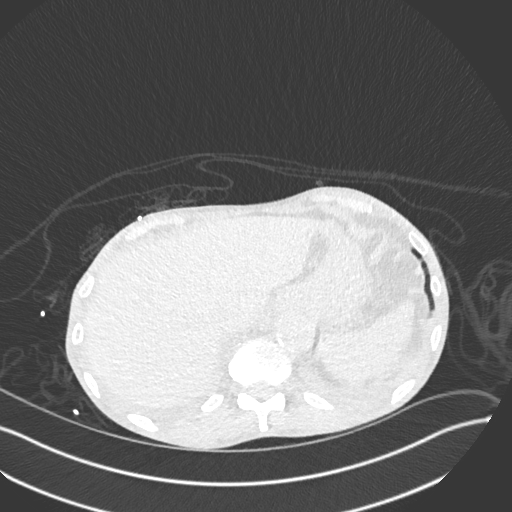
[im 141/564  mediastinal]
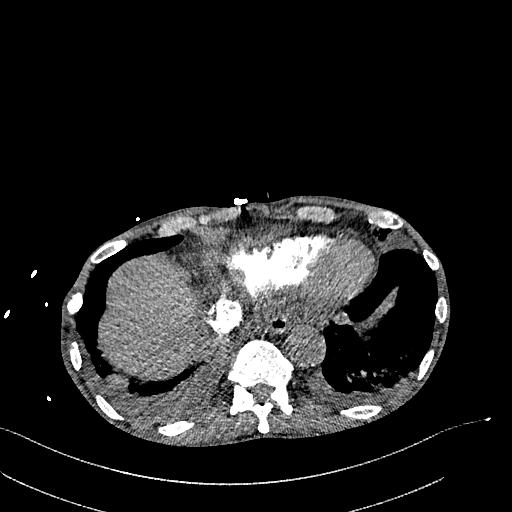
[im 176/564  lung]
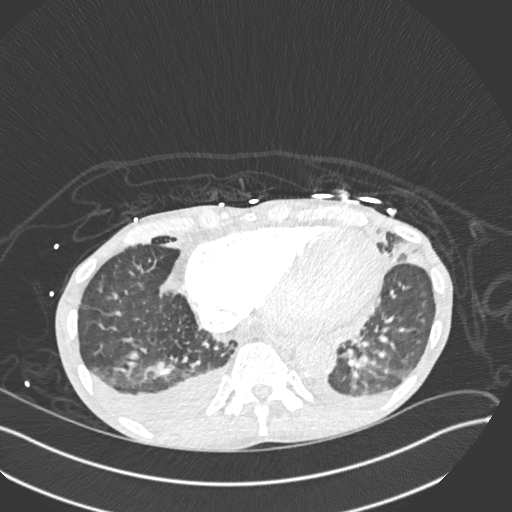
[im 212/564  mediastinal]
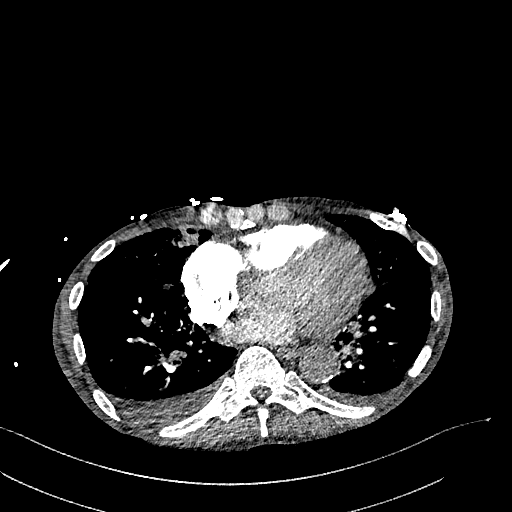
[im 247/564  lung]
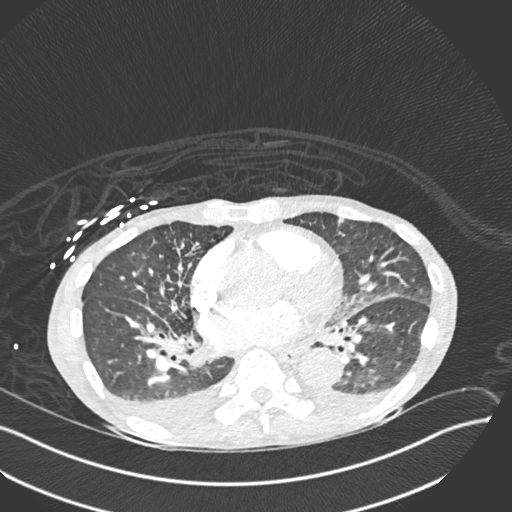
[im 282/564  mediastinal]
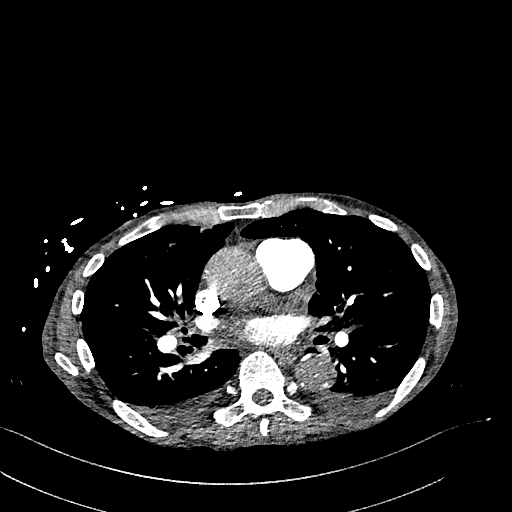
[im 317/564  lung]
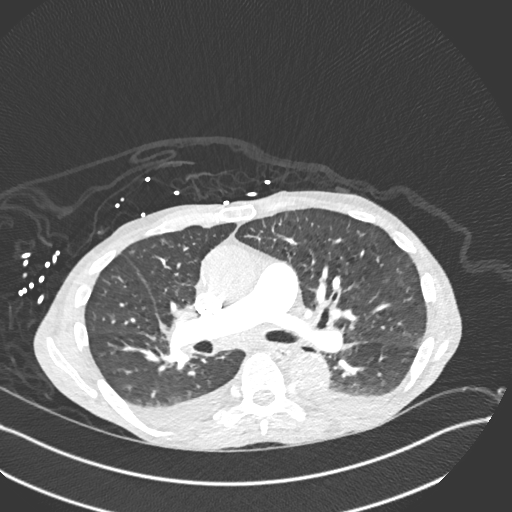
[im 352/564  mediastinal]
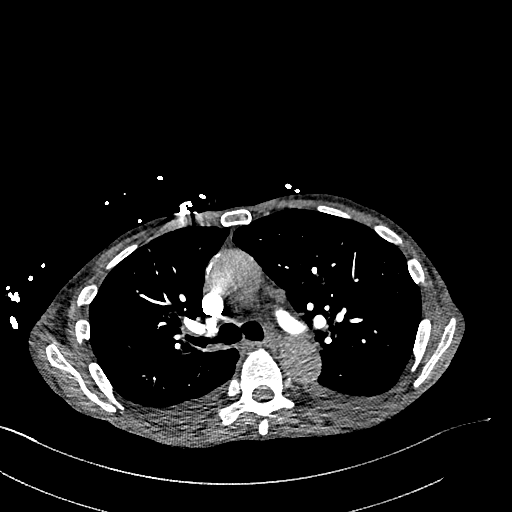
[im 388/564  lung]
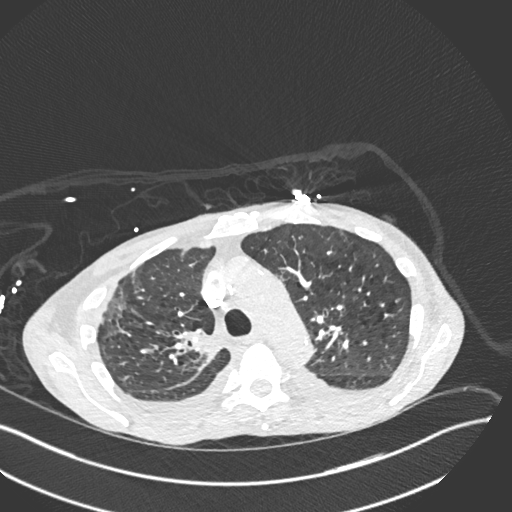
[im 423/564  mediastinal]
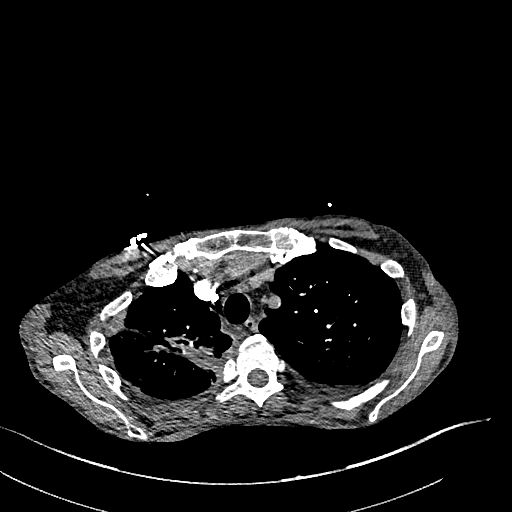
[im 458/564  lung]
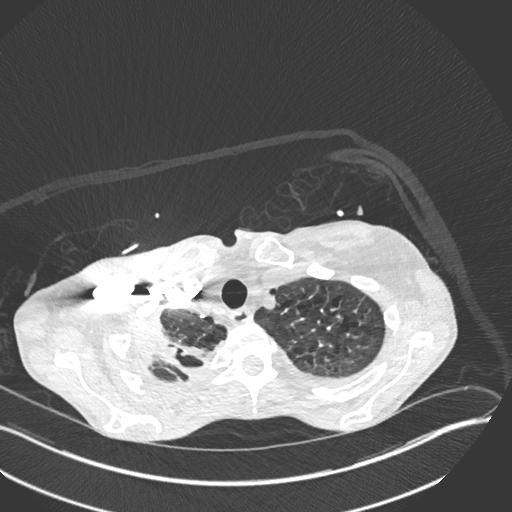
[im 493/564  mediastinal]
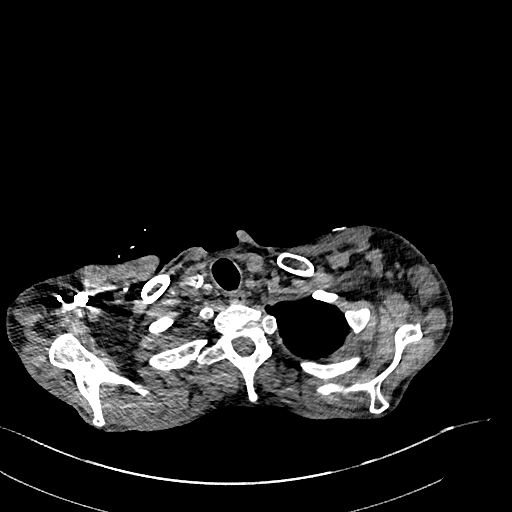
[im 528/564  lung]
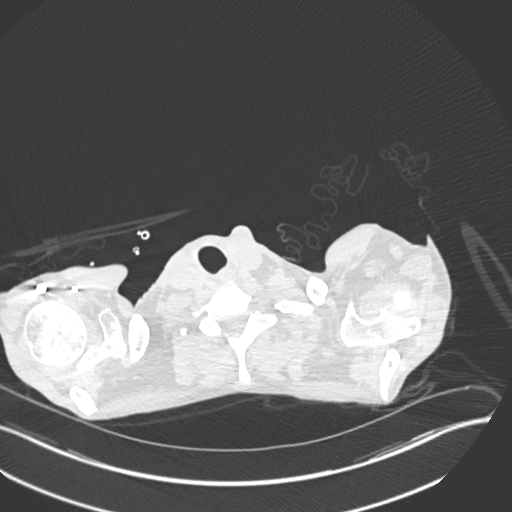

[Series 7: pe lung · axial · 0.80mm/px · z∈[+1357,+1443]mm · 2 of 171 slices shown]
[im 43/171  mediastinal]
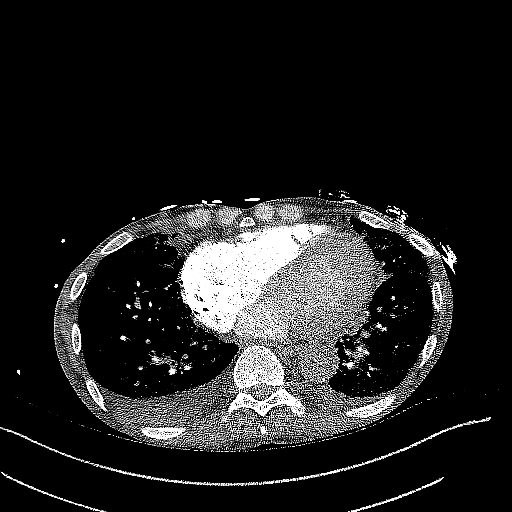
[im 86/171  mediastinal]
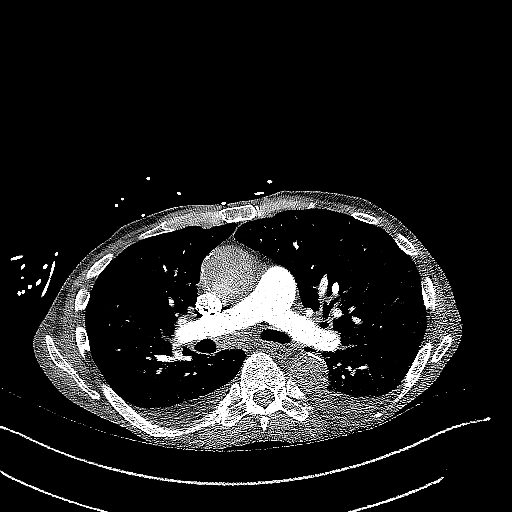

[Series 8: pe 2mm cor · coronal · 0.78mm/px · 1 of 111 slices shown]
[im 56/111  mediastinal]
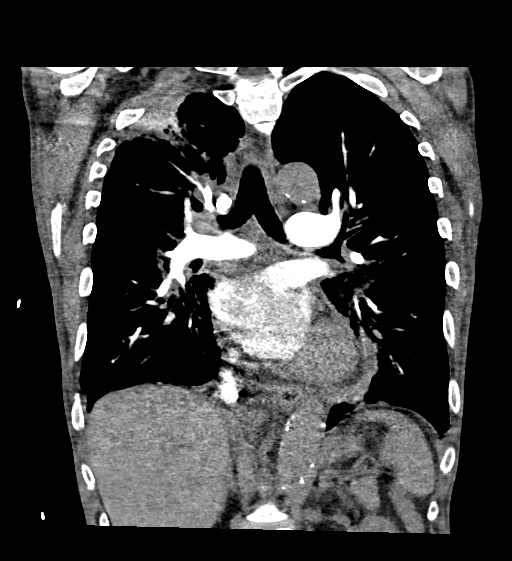

[18 of 36 positions shown; findings below may reference images not displayed]

FINDINGS: Cardiovascular: Satisfactory opacification of the pulmonary arteries
to the segmental level. No evidence of pulmonary embolism. Normal
heart size. No pericardial effusion. Atherosclerosis of thoracic
aorta is noted. Aortic root measures 5.1 cm which is aneurysmal.
cm ascending thoracic aortic aneurysm is noted.

Mediastinum/Nodes: No enlarged mediastinal, hilar, or axillary lymph
nodes. Thyroid gland, trachea, and esophagus demonstrate no
significant findings.

Lungs/Pleura: No pneumothorax is noted. Small bilateral pleural
effusions are noted. Mild bibasilar subsegmental atelectasis is
noted. Mild emphysematous disease is noted in both upper lobes.
There is new large right upper lobe opacity concerning for
cavitating pneumonia measuring 5.2 x 3.1 cm.

Upper Abdomen: No acute abnormality.

Musculoskeletal: No chest wall abnormality. No acute or significant
osseous findings.

Review of the MIP images confirms the above findings.
IMPRESSION: 1. No definite evidence of pulmonary embolus.
2. Small bilateral pleural effusions are noted with mild bibasilar
subsegmental atelectasis.
3. Large right upper lobe opacity is noted concerning for cavitating
pneumonia. Follow-up CT scan in 2-3 weeks after treatment is
recommended to ensure resolution and rule out underlying malignancy.
4. 4.5 cm ascending thoracic aortic aneurysm is noted. Ascending
thoracic aortic aneurysm. Recommend semi-annual imaging followup by
CTA or MRA and referral to cardiothoracic surgery if not already
obtained. This recommendation follows 8919
ACCF/AHA/AATS/ACR/ASA/SCA/BEULAH/ERXLEBEN/MALEFO/IMTHIYAAZ Guidelines for the
Diagnosis and Management of Patients With Thoracic Aortic Disease.
Circulation. 8919; 121: E266-e369. Aortic aneurysm NOS
(6C50R-IDZ.A).
5. Aortic root is aneurysmal measuring 5.1 cm in diameter.

Aortic Atherosclerosis (6C50R-BQM.M) and Emphysema (6C50R-9TD.E).

## 2021-08-18 ENCOUNTER — Encounter: Payer: Self-pay | Admitting: Cardiothoracic Surgery

## 2021-08-18 ENCOUNTER — Ambulatory Visit (INDEPENDENT_AMBULATORY_CARE_PROVIDER_SITE_OTHER): Payer: No Typology Code available for payment source | Admitting: Cardiothoracic Surgery

## 2021-08-18 VITALS — BP 114/77 | HR 83 | Resp 20 | Ht 77.0 in | Wt 132.8 lb

## 2021-08-18 DIAGNOSIS — I712 Thoracic aortic aneurysm, without rupture, unspecified: Secondary | ICD-10-CM | POA: Diagnosis not present

## 2021-08-18 DIAGNOSIS — R042 Hemoptysis: Secondary | ICD-10-CM

## 2021-08-18 NOTE — Progress Notes (Signed)
HPI: The statu home Mr. Mr. Weight more overweight more okay alright okay so tell him a stone I will have to look at these and then we will go numb but may help we will have him come back in August Patient presents over 2 years following his last entry into the [facility] system when he was admitted to [facility] for right upper lobe pneumonia/abscess in April 2021 from probable aspiration.  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  He states he was recently hospitalized at [facility] facility for a right upper lobe mass which sounds suspicious for cancer.  He has had associated hemoptysis as well recently.  He was placed on oral Augmentin and he is completing a 2-week course.  He has had an associated weight loss, decreased exercise tolerance and generalized debility. This all seems to be very suspicious for lung cancer.  Patient states he is here today because coming to this office is much more convenient than driving all the way to [facility] for medical care.  Without any records from the [place] system for which se has received treatment for that over the past 2 years is difficult to assess his current health situation.  We will attempt to receive the reports from his most recent CT scans of the chest taken earlier this month at the [place] [facility] [place].  The patient admits to continued heavy smoking and drinking.  He denies chest pain.  He denies significant change in baseline shortness of breath with exertion.  He states he has been compliant with his medications prescribed by the [place] system. Current Outpatient Medications  Medication Sig Dispense Refill   amoxicillin-clavulanate (AUGMENTIN) 875-125 MG tablet Take 1 tablet by mouth 2 (two) times daily.     carvedilol (COREG) 6.25 MG tablet Take 1 tablet  (6.25 mg total) by mouth 2 (two) times daily with a meal. 60 tablet 0   cholecalciferol (VITAMIN D3) 25 MCG (1000 UNIT) tablet Take 1,000 Units by mouth daily.     feeding supplement, ENSURE ENLIVE, (ENSURE ENLIVE) LIQD Take 237 mLs by mouth 3 (three) times daily between meals. 237 mL 12   folic acid (FOLVITE) 1 MG tablet Take 1 tablet (1 mg total) by mouth daily. 30 tablet 0   furosemide (LASIX) 20 MG tablet Take 1 tablet (20 mg total) by mouth daily. 30 tablet 0   Lidocaine (HM LIDOCAINE PATCH) 4 % PTCH Apply 1 patch topically daily as needed (Back pain).     Multiple Vitamin (MULTIVITAMIN WITH MINERALS) TABS tablet Take 1 tablet by mouth daily.     sacubitril-valsartan (ENTRESTO) 24-26 MG Take 1 tablet by mouth 2 (two) times daily. 60 tablet 0   thiamine (VITAMIN B-1) 50 MG tablet Take 50 mg by mouth 2 (two) times daily.     vitamin B-12 1000 MCG tablet Take 1 tablet (1,000 mcg total) by mouth daily. 30 tablet 0   atorvastatin (LIPITOR) 20 MG tablet Take 1 tablet (20 mg total) by mouth daily. 30 tablet 0   No current facility-administered medications for this visit.     Physical Exam: Blood pressure 114/77, pulse 83, resp. rate 20, height 6\' 5"  (1.956 m), weight 132 lb 12.8 oz (60.2 kg), SpO2 100 %.   General-thin poorly kept male no acute distress accompanied by his  wife HEENT-recent dental work with caps placed on his teeth but mid multiple missing teeth Neck-no palpable adenopathy or JVD Cardiac-heart rhythm regular, no murmur Lungs-diminished breath sounds bilaterally, scattered rhonchi bilaterally Abdomen-scaphoid without pulsatile mass or tenderness Extremities-no palpable pedal pulses, but no pedal edema Neuro-no focal motor weakness noted  Diagnostic Tests: No testing done today we will try to obtain reports [facility] for [facility] issues earlier this month.  Impression: #1 history of of pneumonia, more recent hemoptysis and reported by  patient of possible right lung mass #2 history of fusiform ascending thoracic aortic aneurysm, asymptomatic and probably stable #3 history of heavy tobacco and alcohol abuse  Plan: Patient has a CT scan of the chest scheduled at [facility] clinic in about 2 weeks.  We will have him return after that study and further discuss the above issues and whether surgical therapy would be of benefit.   Lovett Sox, MD [facility] [phone]

## 2021-09-25 ENCOUNTER — Other Ambulatory Visit: Payer: Self-pay | Admitting: *Deleted

## 2021-09-25 DIAGNOSIS — Z720 Tobacco use: Secondary | ICD-10-CM

## 2021-09-25 DIAGNOSIS — I712 Thoracic aortic aneurysm, without rupture, unspecified: Secondary | ICD-10-CM

## 2021-09-29 ENCOUNTER — Ambulatory Visit: Payer: Self-pay | Admitting: Cardiothoracic Surgery

## 2021-10-27 ENCOUNTER — Ambulatory Visit: Payer: Self-pay | Admitting: Cardiothoracic Surgery

## 2021-11-24 ENCOUNTER — Ambulatory Visit: Payer: Self-pay | Admitting: Cardiothoracic Surgery

## 2021-11-25 ENCOUNTER — Encounter: Payer: Self-pay | Admitting: Cardiothoracic Surgery

## 2021-11-25 ENCOUNTER — Ambulatory Visit (INDEPENDENT_AMBULATORY_CARE_PROVIDER_SITE_OTHER): Payer: No Typology Code available for payment source | Admitting: Cardiothoracic Surgery

## 2021-11-25 VITALS — BP 121/81 | HR 101 | Resp 20 | Ht 77.0 in | Wt 131.0 lb

## 2021-11-25 DIAGNOSIS — I700 Atherosclerosis of aorta: Secondary | ICD-10-CM | POA: Diagnosis not present

## 2021-11-25 DIAGNOSIS — I712 Thoracic aortic aneurysm, without rupture, unspecified: Secondary | ICD-10-CM

## 2021-11-25 NOTE — Progress Notes (Signed)
HPI:  Mr. Chimento returns for follow-up visit after being early seen in May 2023 for a fusiform ascending aneurysm stable for several years.  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 Mayford Knife.  The aortic valve is trileaflet with minimal regurgitation.  Patient continues to drink alcohol and smoke cigarettes.  He denies chest pain or significant breathing issues.  Patient brings a CT scan of the chest performed October 4 at [facility].  This shows no change in the size of the fusiform aneurysm with measurement of 4.6 cm at the sinotubular junction and proximally 4.8 cm at the level of the coronary sinuses.  Patient has been seen and evaluated extensively by both the [facility] and [facility] for a right upper lobe pulmonary density which is residual from a cavitary pneumonia treated with about a week of hospitalization in 2021.  The recent CT scan shows interval and progressive improvement in this area of old inflammation indicating scar.  A PET scan apparently has been done to [facility] which showed low-level activity.  However there are some areas of thickening in the linear scar density and the patient is scheduled to undergo bronchoscopy and biopsy at [facility] on November 2.  I told him this was all appropriate and that the most important thing would be for him to stop smoking. Current Outpatient Medications  Medication Sig Dispense Refill   amoxicillin-clavulanate (AUGMENTIN) 875-125 MG tablet Take 1 tablet by mouth 2 (two) times daily.     atorvastatin (LIPITOR) 20 MG tablet Take 1 tablet (20 mg total) by mouth daily. 30 tablet 0   carvedilol (COREG) 6.25 MG tablet Take 1 tablet (6.25 mg total) by mouth 2 (two) times daily with a meal. 60 tablet 0   cholecalciferol (VITAMIN D3) 25 MCG (1000 UNIT) tablet Take 1,000 Units by mouth daily.     feeding supplement, ENSURE ENLIVE, (ENSURE ENLIVE) LIQD Take 237 mLs by mouth 3 (three) times  daily between meals. 237 mL 12   folic acid (FOLVITE) 1 MG tablet Take 1 tablet (1 mg total) by mouth daily. 30 tablet 0   furosemide (LASIX) 20 MG tablet Take 1 tablet (20 mg total) by mouth daily. 30 tablet 0   Lidocaine (HM LIDOCAINE PATCH) 4 % PTCH Apply 1 patch topically daily as needed (Back pain).     Multiple Vitamin (MULTIVITAMIN WITH MINERALS) TABS tablet Take 1 tablet by mouth daily.     sacubitril-valsartan (ENTRESTO) 24-26 MG Take 1 tablet by mouth 2 (two) times daily. 60 tablet 0   thiamine (VITAMIN B-1) 50 MG tablet Take 50 mg by mouth 2 (two) times daily.     vitamin B-12 1000 MCG tablet Take 1 tablet (1,000 mcg total) by mouth daily. 30 tablet 0   No current facility-administered medications for this visit.     Physical Exam: Blood pressure 121/81, pulse (!) 101, resp. rate 20, height 6\' 5"  (1.956 m), weight 131 lb (59.4 kg), SpO2 95 %.   Diagnostic Tests: No scans at this facility CT scan performed October 4 at [facility] personally reviewed with results as noted above  Impression: Stable fusiform ascending aneurysm measuring 4.6 cm at the sinotubular junction slightly larger at the coronary sinuses  Probable alcoholic cardiomyopathy EF 35 to 40% with a trileaflet aortic valve and minimal AI  Right upper lobe cavitary scar being evaluated pulmonary medicine at [facility] as well in the [facility] hospital at [place].  Plan:  Continue annual surveillance scans of the thoracic aorta  Patient is recommended to stop smoking Recommended to reduce/stop alcohol intake  due to cardiomyopathy  Dahlia Byes, MD [facility] [phone]

## 2022-05-21 ENCOUNTER — Other Ambulatory Visit: Payer: Self-pay | Admitting: Cardiothoracic Surgery

## 2022-05-21 DIAGNOSIS — I7121 Aneurysm of the ascending aorta, without rupture: Secondary | ICD-10-CM

## 2022-06-26 ENCOUNTER — Encounter: Payer: Self-pay | Admitting: *Deleted

## 2022-06-29 ENCOUNTER — Other Ambulatory Visit: Payer: No Typology Code available for payment source

## 2022-06-29 ENCOUNTER — Ambulatory Visit: Payer: No Typology Code available for payment source | Admitting: Cardiothoracic Surgery

## 2022-07-11 DEATH — deceased
